# Patient Record
Sex: Female | Born: 1975 | Hispanic: Yes | Marital: Married | Smoking: Never smoker
Health system: Southern US, Community
[De-identification: ages and names within clinical notes are randomized; demographics above are authoritative.]

## PROBLEM LIST (undated history)

## (undated) ENCOUNTER — Inpatient Hospital Stay (HOSPITAL_COMMUNITY): Payer: Self-pay

## (undated) DIAGNOSIS — IMO0001 Reserved for inherently not codable concepts without codable children: Secondary | ICD-10-CM

## (undated) DIAGNOSIS — O26613 Liver and biliary tract disorders in pregnancy, third trimester: Secondary | ICD-10-CM

## (undated) DIAGNOSIS — K8020 Calculus of gallbladder without cholecystitis without obstruction: Secondary | ICD-10-CM

## (undated) DIAGNOSIS — B029 Zoster without complications: Secondary | ICD-10-CM

## (undated) DIAGNOSIS — D649 Anemia, unspecified: Secondary | ICD-10-CM

## (undated) DIAGNOSIS — N719 Inflammatory disease of uterus, unspecified: Secondary | ICD-10-CM

## (undated) DIAGNOSIS — K5900 Constipation, unspecified: Secondary | ICD-10-CM

## (undated) HISTORY — DX: Liver and biliary tract disorders in pregnancy, third trimester: O26.613

## (undated) HISTORY — DX: Constipation, unspecified: K59.00

## (undated) HISTORY — DX: Calculus of gallbladder without cholecystitis without obstruction: K80.20

## (undated) HISTORY — PX: WISDOM TOOTH EXTRACTION: SHX21

## (undated) HISTORY — DX: Reserved for inherently not codable concepts without codable children: IMO0001

## (undated) HISTORY — DX: Zoster without complications: B02.9

---

## 2007-01-13 ENCOUNTER — Encounter: Admission: RE | Admit: 2007-01-13 | Discharge: 2007-01-13 | Payer: Self-pay | Admitting: Family Medicine

## 2007-07-26 ENCOUNTER — Emergency Department (HOSPITAL_COMMUNITY): Admission: EM | Admit: 2007-07-26 | Discharge: 2007-07-26 | Payer: Self-pay | Admitting: Emergency Medicine

## 2007-09-08 ENCOUNTER — Ambulatory Visit: Payer: Self-pay | Admitting: Family Medicine

## 2007-09-08 LAB — CONVERTED CEMR LAB
Basophils Absolute: 0 K/uL
Basophils Relative: 0 %
Eosinophils Absolute: 0 K/uL
Eosinophils Relative: 0 %
HCT: 37.9 %
Hemoglobin: 12.5 g/dL
Lymphocytes Relative: 28 %
Lymphs Abs: 2 K/uL
MCHC: 33 g/dL
MCV: 87.3 fL
Monocytes Absolute: 0.3 K/uL
Monocytes Relative: 4 %
Neutro Abs: 4.8 K/uL
Neutrophils Relative %: 68 %
Platelets: 213 K/uL
RBC: 4.34 M/uL
RDW: 13.8 %
Sed Rate: 7 mm/h
T3, Total: 116 ng/dL
T4, Total: 8.3 ug/dL
TSH: 0.514 u[IU]/mL
WBC: 7.1 10*3/microliter

## 2007-09-11 ENCOUNTER — Ambulatory Visit: Payer: Self-pay | Admitting: Internal Medicine

## 2007-09-11 ENCOUNTER — Encounter: Payer: Self-pay | Admitting: Family Medicine

## 2007-09-11 LAB — CONVERTED CEMR LAB
AST: 15 units/L (ref 0–37)
Albumin: 4.4 g/dL (ref 3.5–5.2)
BUN: 11 mg/dL (ref 6–23)
Chloride: 107 meq/L (ref 96–112)
Creatinine, Ser: 0.72 mg/dL (ref 0.40–1.20)
HDL: 52 mg/dL (ref >39)
LDL Cholesterol: 76 mg/dL (ref 0–99)
Potassium: 4.5 meq/L (ref 3.5–5.3)
Total CHOL/HDL Ratio: 2.9

## 2007-09-12 ENCOUNTER — Ambulatory Visit: Payer: Self-pay | Admitting: *Deleted

## 2007-09-12 ENCOUNTER — Ambulatory Visit (HOSPITAL_COMMUNITY): Admission: RE | Admit: 2007-09-12 | Discharge: 2007-09-12 | Payer: Self-pay | Admitting: Family Medicine

## 2007-10-11 ENCOUNTER — Ambulatory Visit: Payer: Self-pay | Admitting: Internal Medicine

## 2007-11-13 ENCOUNTER — Emergency Department (HOSPITAL_COMMUNITY): Admission: EM | Admit: 2007-11-13 | Discharge: 2007-11-13 | Payer: Self-pay | Admitting: Family Medicine

## 2008-03-29 DIAGNOSIS — B029 Zoster without complications: Secondary | ICD-10-CM

## 2008-03-29 HISTORY — DX: Zoster without complications: B02.9

## 2008-05-10 ENCOUNTER — Encounter: Payer: Self-pay | Admitting: Family Medicine

## 2008-05-10 ENCOUNTER — Ambulatory Visit: Payer: Self-pay | Admitting: Internal Medicine

## 2008-05-10 LAB — CONVERTED CEMR LAB: Chlamydia, DNA Probe: NEGATIVE

## 2008-05-22 ENCOUNTER — Ambulatory Visit: Payer: Self-pay | Admitting: Family Medicine

## 2008-05-28 ENCOUNTER — Ambulatory Visit (HOSPITAL_COMMUNITY): Admission: RE | Admit: 2008-05-28 | Discharge: 2008-05-28 | Payer: Self-pay | Admitting: Family Medicine

## 2008-05-28 ENCOUNTER — Encounter (INDEPENDENT_AMBULATORY_CARE_PROVIDER_SITE_OTHER): Payer: Self-pay | Admitting: *Deleted

## 2008-06-26 ENCOUNTER — Ambulatory Visit: Payer: Self-pay | Admitting: Family Medicine

## 2008-09-02 ENCOUNTER — Ambulatory Visit: Payer: Self-pay | Admitting: Internal Medicine

## 2008-11-01 ENCOUNTER — Ambulatory Visit: Payer: Self-pay | Admitting: Internal Medicine

## 2008-11-14 ENCOUNTER — Ambulatory Visit: Payer: Self-pay | Admitting: Gastroenterology

## 2008-11-14 DIAGNOSIS — K219 Gastro-esophageal reflux disease without esophagitis: Secondary | ICD-10-CM

## 2008-11-21 ENCOUNTER — Telehealth: Payer: Self-pay | Admitting: Gastroenterology

## 2008-11-27 ENCOUNTER — Telehealth (INDEPENDENT_AMBULATORY_CARE_PROVIDER_SITE_OTHER): Payer: Self-pay | Admitting: *Deleted

## 2008-11-29 ENCOUNTER — Encounter (INDEPENDENT_AMBULATORY_CARE_PROVIDER_SITE_OTHER): Payer: Self-pay | Admitting: *Deleted

## 2008-12-25 ENCOUNTER — Encounter: Payer: Self-pay | Admitting: Gastroenterology

## 2008-12-25 ENCOUNTER — Telehealth: Payer: Self-pay | Admitting: Gastroenterology

## 2009-01-03 ENCOUNTER — Ambulatory Visit: Payer: Self-pay | Admitting: Internal Medicine

## 2009-01-23 ENCOUNTER — Ambulatory Visit: Payer: Self-pay | Admitting: Gastroenterology

## 2010-12-22 LAB — POCT I-STAT, CHEM 8
BUN: 15
Calcium, Ion: 1.26
Hemoglobin: 13.3
Potassium: 4
Sodium: 140
TCO2: 26

## 2010-12-22 LAB — POCT RAPID STREP A: Streptococcus, Group A Screen (Direct): NEGATIVE

## 2012-05-15 ENCOUNTER — Encounter: Payer: Self-pay | Admitting: Gynecology

## 2012-05-15 ENCOUNTER — Other Ambulatory Visit (HOSPITAL_COMMUNITY)
Admission: RE | Admit: 2012-05-15 | Discharge: 2012-05-15 | Disposition: A | Discharge status: Home / Self Care | Payer: BC Managed Care – PPO | Source: Ambulatory Visit | Attending: Gynecology | Admitting: Gynecology

## 2012-05-15 ENCOUNTER — Ambulatory Visit (INDEPENDENT_AMBULATORY_CARE_PROVIDER_SITE_OTHER): Payer: BC Managed Care – PPO | Admitting: Gynecology

## 2012-05-15 VITALS — BP 120/76 | Ht 64.0 in | Wt 154.0 lb

## 2012-05-15 DIAGNOSIS — N318 Other neuromuscular dysfunction of bladder: Secondary | ICD-10-CM

## 2012-05-15 DIAGNOSIS — R635 Abnormal weight gain: Secondary | ICD-10-CM

## 2012-05-15 DIAGNOSIS — Z01419 Encounter for gynecological examination (general) (routine) without abnormal findings: Secondary | ICD-10-CM | POA: Insufficient documentation

## 2012-05-15 DIAGNOSIS — Z833 Family history of diabetes mellitus: Secondary | ICD-10-CM | POA: Insufficient documentation

## 2012-05-15 DIAGNOSIS — N3281 Overactive bladder: Secondary | ICD-10-CM

## 2012-05-15 DIAGNOSIS — Z1151 Encounter for screening for human papillomavirus (HPV): Secondary | ICD-10-CM | POA: Insufficient documentation

## 2012-05-15 DIAGNOSIS — N644 Mastodynia: Secondary | ICD-10-CM

## 2012-05-15 LAB — CBC WITH DIFFERENTIAL/PLATELET
Eosinophils Absolute: 0.1 10*3/uL (ref 0.0–0.7)
HCT: 36.3 % (ref 36.0–46.0)
Hemoglobin: 12.1 g/dL (ref 12.0–15.0)
Lymphocytes Relative: 31 % (ref 12–46)
Lymphs Abs: 2 10*3/uL (ref 0.7–4.0)
MCHC: 33.3 g/dL (ref 30.0–36.0)
Monocytes Absolute: 0.4 10*3/uL (ref 0.1–1.0)
Neutro Abs: 4.1 10*3/uL (ref 1.7–7.7)
Neutrophils Relative %: 60 % (ref 43–77)
RBC: 4.36 MIL/uL (ref 3.87–5.11)
RDW: 14.2 % (ref 11.5–15.5)

## 2012-05-15 LAB — HEMOGLOBIN A1C
Hgb A1c MFr Bld: 5.9 % — ABNORMAL HIGH (ref <5.7)
Mean Plasma Glucose: 123 mg/dL — ABNORMAL HIGH (ref <117)

## 2012-05-15 LAB — TSH: TSH: 1.727 u[IU]/mL (ref 0.350–4.500)

## 2012-05-15 NOTE — Patient Instructions (Addendum)
Informacin para el paciente: Tratamientos para la incontinencia por urgencia en mujeres   Qu es la incontinencia por urgencia? - "Incontinencia" es el trmino mdico que se [place] cuando una persona gotea orina o pierde el control de la vejiga. Las personas con incontinencia por urgencia sienten una fuerte necesidad o la "urgencia" de Geographical information systems officer de repente. A menudo, es tan grande la urgencia que no llegan al bao a tiempo. Si tiene una de estos deseos urgentes y repentinos de Geographical information systems officer pero no pierde [place], es posible que tenga un padecimiento llamado "vejiga hiperactiva". La incontinencia por urgencia tambin se conoce como incontinencia por emergencia.  La incontinencia por urgencia es comn, especialmente en mujeres, pero existen tratamientos que pueden ayudar. Si tiene incontinencia por urgencia o vejiga hiperactiva, no tiene que "resignarse a ello".  Hay algo que pueda hacer por mi cuenta para la incontinencia por urgencia? - S. Los siguientes pasos pueden ayudar a reducir las prdidas de orina o los deseos urgentes de orinar:  ?Disminuya la cantidad de alimentos o bebidas que [employer] sntomas. El alcohol, la cafena, las comidas picantes o cidas, o los edulcorantes artificiales hacen que algunas personas deban orinar ms seguido o causan deseos urgentes y repentinos de Geographical information systems officer. ?Intente no beber demasiado antes de acostarse.  ?Evite el estreimiento - El estreimiento es un problema comn que dificulta la evacuacin y puede empeorar la incontinencia por [place]. Tambin puede probar algunas cosas para ayudar con el control de la vejiga: ?Entrenamiento de vejiga - El entrenamiento de vejiga ayuda a la vejiga a retener ms orina para que pueda orinar con Scientist, research (physical sciences). Durante el entrenamiento de vejiga, usted va al bao en momentos programados. Por ejemplo, puede decidir ir una vez por hora. Luego, se obliga a ir una vez por hora, incluso si no tiene  ganas. Si necesita ir antes, debe tratar de esperar hasta que transcurra Georgianne Fick. Despus de que se [place] a esperar una hora, puede intentar esperar ms entre cada visita al bao. Con el tiempo, puede llegar a entrenar su vejiga para poder esperar 3 o 4 horas entre las visitas al bao. ?La relajacin puede ayudar a controlar la urgencia de ir al bao. Cuando tenga una urgencia, prese quieto o sintese. Respire profundo, contraiga los msculos plvicos y deje que la "ola" de necesidad de Geographical information systems officer pase. Luego camine lentamente hasta el bao para orinar. ?Ejercicios con los msculos plvicos - Los ejercicios con los msculos plvicos fortalecen los msculos que controlan el flujo de [place]. Estos ejercicios pueden ayudar, pero a menudo las [employer] mal. Pregntele a su mdico o enfermero cmo se hacen correctamente. Cmo tratan los mdicos la incontinencia por urgencia en mujeres? - Los principales tratamientos incluyen medicinas y procedimientos para relajar la vejiga. Estos se analizan en ms detalle a continuacin.  Medicinas para relajar la vejiga - Pueden ayudar a controlar los sntomas. Estas medicinas son, Eusebio Me otras: la oxibutinina (nombre comercial: Ditropan), la tolterodina (nombre comercial: Detrol), la fesoterodina (nombre comercial: Gala Murdoch), la solifenacina (nombre comercial: VESIcare) y la darifenacina (nombre comercial: Enablex). Vienen en pldoras que se toman por la boca, y parches o geles que se colocan en la piel. Las medicinas para incontinencia por urgencia pueden causar efectos secundarios, como por ejemplo: ?Boca muy seca ?Estreimiento  ?Acidez estomacal  ?Dificultad para pensar y recordar cosas ?Visin borrosa ?Frecuencia cardaca rpida ?Somnolencia Si es mayor, pregunte a su mdico si es Research scientist (life sciences). Si tiene problemas para pensar o recordar cosas, algunas de  estas medicinas podran empeorar esos problemas.  Si toma medicinas para la  incontinencia por urgencia o vejiga hiperactiva, es posible que necesite probar varias medicinas hasta que encuentre un tratamiento que funcione para usted. Si las medicinas que relajan la vejiga no Education officer, environmental o le causan demasiados efectos secundarios, hable con su mdico sobre otros tratamientos. Para algunas mujeres que tienen resequedad vaginal despus de la menopausia, las cremas con estrgeno pueden ser un tratamiento eficaz. Procedimientos para ayudar a Engineer, mining vejiga - Si las medicinas no dan resultado para sus sntomas, o si no puede tomar medicinas, su mdico podra sugerir uno de [employer] procedimientos:  ?Una inyeccin de toxina botulnica (Botox) en la vejiga para ayudar a mantenerla relajada - La inyeccin debe colocarse aproximadamente una vez por ao. Estas inyecciones pueden causar problemas para orinar en alrededor de 1 de cada 4 mujeres que las reciben. ?Tratamiento con estimulacin nerviosa elctrica - Se realiza con un dispositivo que se coloca debajo de la piel, como un marcapasos. La estimulacin elctrica enva seales elctricas leves a los nervios que afectan la vejiga. Las seales no duelen. Este tratamiento puede reducir los deseos urgentes y repentinos de Geographical information systems officer, o la necesidad de Geographical information systems officer con frecuencia. Qu tan eficaces son los tratamientos para la incontinencia por urgencia en mujeres? - Depende de los sntomas de la mujer y de la causa de la incontinencia por [place]. Por ejemplo, si otro padecimiento de salud est causando la incontinencia, tratar ese padecimiento podra ayudar.  Si usted est recibiendo tratamiento para la incontinencia por urgencia, es posible que deba combinar tratamientos. Por ejemplo, puede tomar medicinas para ayudar con los sntomas de la incontinencia por [place] y adems hacer entrenamiento de vejiga. El tratamiento puede tardar en funcionar. No se desanime si un tratamiento tarda varios meses o incluso ms para funcionar. Cmo ser mi  vida? - La mayora de las mujeres con incontinencia por urgencia necesitan algn tipo de tratamiento durante mucho tiempo o de por vida. El tratamiento de la incontinencia por urgencia en mujeres puede ayudar con los sntomas, pero no cura la causa de la incontinencia.  Incluso si el tratamiento no elimina por completo la incontinencia por urgencia, puede hacer que su vida sea ms fcil y que se sienta mucho

## 2012-05-15 NOTE — Progress Notes (Signed)
Sheila Foster [birth year] [id number]   History:    37 y.o.  for annual gyn exam who is new to the practiceand had several complaints. She states that sometimes she has urgency incontinence and goes to the bathroom frequently but does not have to get up at night to urinate. She also suffers at time from breast tenderness right before her menses. She also had a cesarean section in [place] several years ago with a midline incision which patient states at times causes her some discomfort. She is considering getting pregnant in the next few months but wanted to make sure everything was fine. Her first child was delivered vaginally the second child by C-section due to nuchal cord possibly fetal distress. She is using condoms for contraception. She states she's had normal Pap smears in the past. She states her cycles are regular with the exception of December cycle and January start a week early. She in in frequently does her self breast examination. Several years ago she had an ultrasound the right breast which was benign. Patient's father had insulin-dependent diabetes and died from cardiovascular disease as well. Patient has complained of increased weight as well.  Past medical history,surgical history, family history and social history were all reviewed and documented in the EPIC chart.  Gynecologic History Patient's last menstrual period was 05/09/2012. Contraception: condoms Last [facility]. Results were: normal Last mammogram: not indicated. Results were: normal  Obstetric History OB History   Grav Para Term Preterm Abortions TAB SAB Ect Mult Living   2 2        2      # Outc Date GA Lbr Len/2nd Wgt Sex Del Anes PTL Lv   1 PAR            2 PAR                ROS: A ROS was performed and pertinent positives and negatives are included in the history.  GENERAL: No fevers or chills. HEENT: No change in vision, no earache, sore throat or sinus congestion. NECK: No  pain or stiffness. CARDIOVASCULAR: No chest pain or pressure. No palpitations. PULMONARY: No shortness of breath, cough or wheeze. GASTROINTESTINAL: No abdominal pain, nausea, vomiting or diarrhea, melena or bright red blood per rectum. GENITOURINARY: No urinary frequency, urgency, hesitancy or dysuria. MUSCULOSKELETAL: No joint or muscle pain, no back pain, no recent trauma. DERMATOLOGIC: No rash, no itching, no lesions. ENDOCRINE: No polyuria, polydipsia, no heat or cold intolerance. No recent change in weight. HEMATOLOGICAL: No anemia or easy bruising or bleeding. NEUROLOGIC: No headache, seizures, numbness, tingling or weakness. PSYCHIATRIC: No depression, no loss of interest in normal activity or change in sleep pattern.     Exam: chaperone present  BP 120/76  Ht 5\' 4"  (1.626 m)  Wt 154 lb (69.854 kg)  BMI 26.42 kg/m2  LMP 05/09/2012  Body mass index is 26.42 kg/(m^2).  General appearance : Well developed well nourished female. No acute distress HEENT: Neck supple, trachea midline, no carotid bruits, no thyroidmegaly Lungs: Clear to auscultation, no rhonchi or wheezes, or rib retractions  Heart: Regular rate and rhythm, no murmurs or gallops Breast:Examined in sitting and supine position were symmetrical in appearance, no palpable masses or tenderness,  no skin retraction, no nipple inversion, no nipple discharge, no skin discoloration, no axillary or supraclavicular lymphadenopathy Abdomen: no palpable masses or tenderness, no rebound or guarding Extremities: no edema or skin discoloration or tenderness  Pelvic:  Bartholin, Urethra, Skene Glands: Within normal limits             Vagina: No gross lesions or discharge  Cervix: No gross lesions or discharge  Uterus  anteverted, normal size, shape and consistency, non-tender and mobile  Adnexa  Without masses or tenderness  Anus and perineum  normal   Rectovaginal  normal sphincter tone without palpated masses or tenderness              Hemoccult not indicated     Assessment/Plan:  37 y.o. female for annual exam a we'll try to obtain a copy of her surgical report from [place] so that we can sit down and discuss if she had a midline uterine incision or not so that when she does conceive her obstetrician we'll have to know this information to determine route of delivery. Patient was instructed to start taking vitamin E 600 units daily for her mastodynia especially right before her menses. Because her family history of diabetes a hemoglobin A1c will be drawn today along with her CBC, cholesterol, and urinalysis. Pap smear was done today. 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    Ok Edwards MD, 2:15 PM 05/15/2012

## 2012-05-16 ENCOUNTER — Other Ambulatory Visit: Payer: Self-pay | Admitting: Anesthesiology

## 2012-05-16 DIAGNOSIS — R7309 Other abnormal glucose: Secondary | ICD-10-CM

## 2012-05-16 LAB — URINALYSIS W MICROSCOPIC + REFLEX CULTURE
Bilirubin Urine: NEGATIVE
Casts: NONE SEEN
Glucose, UA: NEGATIVE mg/dL
Hgb urine dipstick: NEGATIVE

## 2012-05-17 ENCOUNTER — Encounter: Payer: Self-pay | Admitting: Gynecology

## 2012-05-17 ENCOUNTER — Other Ambulatory Visit: Payer: BC Managed Care – PPO

## 2012-05-17 ENCOUNTER — Ambulatory Visit (INDEPENDENT_AMBULATORY_CARE_PROVIDER_SITE_OTHER): Payer: BC Managed Care – PPO | Admitting: Gynecology

## 2012-05-17 VITALS — BP 120/78

## 2012-05-17 DIAGNOSIS — R7309 Other abnormal glucose: Secondary | ICD-10-CM

## 2012-05-17 DIAGNOSIS — N390 Urinary tract infection, site not specified: Secondary | ICD-10-CM

## 2012-05-17 LAB — GLUCOSE, RANDOM: Glucose, Bld: 101 mg/dL — ABNORMAL HIGH (ref 70–99)

## 2012-05-17 MED ORDER — NITROFURANTOIN MONOHYD MACRO 100 MG PO CAPS: 100.0000 mg | ORAL_CAPSULE | Freq: Two times a day (BID) | ORAL | Status: DC

## 2012-05-17 NOTE — Progress Notes (Signed)
Patient presented to the office complaining of several days of low abdominal discomfort and urinary frequency. She was seen in the office for annual exam on February 17. Her urine culture results came back today with the following:  Colony Count  >=100,000 COLONIES/ML   Preliminary Report  ENTEROCOCCUS SPECIES   Exam: Abdomen patient slightly tender suprapubic. No CVA tenderness. Pelvic: Bartholin urethra Skene was within normal limits Vagina: No lesions or discharge Cervix: No lesions or discharge uterus: Anteverted slightly tender suprapubically Adnexa: No palpable masses or tenderness Rectal exam: Not done  Assessment/plan: Urinary tract infection patient will be prescribed Macrobid one by mouth twice a day for 7 days.

## 2012-05-17 NOTE — Patient Instructions (Signed)
Infeccin urinaria   (Urinary Tract Infection)   La infeccin urinaria puede ocurrir en cualquier lugar del tracto urinario. El tracto urinario es un sistema de drenaje del cuerpo por el que se eliminan los desechos y el exceso de agua. El tracto urinario incluye dos riones, dos urteres, la vejiga y la uretra. Los riones son rganos que tienen forma de frijol. Cada rin tiene aproximadamente el tamao del puo. Estn situados debajo de las costillas, uno a cada lado de la columna vertebral  CAUSAS   La causa de la infeccin son los microbios, que son organismos microscpicos, que incluyen hongos, virus, y bacterias. Estos organismos son tan pequeos que slo pueden verse a travs del microscopio. Las bacterias son los microorganismos que ms comnmente causan infecciones urinarias.   SNTOMAS   Los sntomas pueden variar segn la edad y el sexo del paciente y por la ubicacin de la infeccin. Los sntomas en las mujeres jvenes incluyen la necesidad frecuente e intensa de orinar y una sensacin dolorosa de ardor en la vejiga o en la uretra durante la miccin. Las mujeres y los hombres mayores podrn sentir cansancio, temblores y debilidad y sentir dolores musculares y dolor abdominal. Si tiene fiebre, puede significar que la infeccin est en los riones. Otros sntomas son dolor en la espalda o en los lados debajo de las costillas, nuseas y vmitos.   DIAGNSTICO   Para diagnosticar una infeccin urinaria, el mdico le preguntar acerca de sus sntomas. Tambin le solicitar una muestra de orina. La muestra de orina se analiza para detectar bacterias y glbulos blancos de la sangre. Los glbulos blancos se forman en el organismo para ayudar a combatir las infecciones.   TRATAMIENTO   Por lo general, las infecciones urinarias pueden tratarse con medicamentos. Debido a que la mayora de las infecciones son causadas por bacterias, por lo general pueden tratarse con antibiticos. La eleccin del antibitico y la  duracin del tratamiento depender de sus sntomas y el tipo de bacteria causante de la infeccin.   INSTRUCCIONES PARA EL CUIDADO EN EL HOGAR    Si le recetaron antibiticos, tmelos exactamente como su mdico le indique. Termine el medicamento aunque se sienta mejor despus de haber tomado slo algunos.   Beba gran cantidad de lquido para mantener la orina de tono claro o color amarillo plido.   Evite la cafena, el t y las bebidas gaseosas. Estas sustancias irritan la vejiga.   Vaciar la vejiga con frecuencia. Evite retener la orina durante largos perodos.   Vace la vejiga antes y despus de tener relaciones sexuales.   Despus de mover el intestino, las mujeres deben higienizarse la regin perineal desde adelante hacia atrs. Use slo un papel tissue por vez.  SOLICITE ATENCIN MDICA SI:    Siente dolor en la espalda.   Le sube la fiebre.   Los sntomas no mejoran luego de 3 das.  SOLICITE ATENCIN MDICA DE INMEDIATO SI:    Siente dolor intenso en la espalda o en la zona inferior del abdomen.   Comienza a sentir escalofros.   Tiene nuseas o vmitos.   Tiene una sensacin continua de quemazn o molestias al orinar.  ASEGRESE DE QUE:    Comprende estas instrucciones.   Controlar su enfermedad.   Solicitar ayuda de inmediato si no mejora o si empeora.  Document Released: 12/23/2004 Document Revised: 09/14/2011  Exit[employer] Patient Information 2013 [employer].

## 2012-05-18 MED ORDER — NITROFURANTOIN MONOHYD MACRO 100 MG PO CAPS: 100.0000 mg | ORAL_CAPSULE | Freq: Two times a day (BID) | ORAL | Status: DC

## 2012-05-18 NOTE — Addendum Note (Signed)
Addended by: Bertram Savin A on: 05/18/2012 02:53 PM   Modules accepted: Orders

## 2012-07-05 ENCOUNTER — Ambulatory Visit: Payer: BC Managed Care – PPO

## 2012-07-05 ENCOUNTER — Ambulatory Visit (INDEPENDENT_AMBULATORY_CARE_PROVIDER_SITE_OTHER): Payer: BC Managed Care – PPO | Admitting: Emergency Medicine

## 2012-07-05 VITALS — BP 102/60 | HR 77 | Temp 98.1°F | Resp 16 | Ht 64.5 in | Wt 154.0 lb

## 2012-07-05 DIAGNOSIS — J209 Acute bronchitis, unspecified: Secondary | ICD-10-CM

## 2012-07-05 DIAGNOSIS — R05 Cough: Secondary | ICD-10-CM

## 2012-07-05 DIAGNOSIS — R059 Cough, unspecified: Secondary | ICD-10-CM

## 2012-07-05 MED ORDER — AZITHROMYCIN 250 MG PO TABS: ORAL_TABLET | ORAL | Status: DC

## 2012-07-05 MED ORDER — HYDROCOD POLST-CHLORPHEN POLST 10-8 MG/5ML PO LQCR: 5.0000 mL | Freq: Two times a day (BID) | ORAL | Status: DC | PRN

## 2012-07-05 NOTE — Progress Notes (Addendum)
[facility] [address], [place] [address] [phone]  Date:  07/05/2012   Name:  Sheila Foster   DOB:  Jun 08, 1975   MRN:  [id number]  PCP:  No primary provider on file.    Chief Complaint: Cough and Back Pain   History of Present Illness:  Sheila Foster is a 37 y.o. very pleasant female patient who presents with the following:  Has a cough productive purulent sputum for over a week.  No wheezing or shortness of breath.  Trace of blood now with sputum production.  No fever but chills.  No nausea or vomiting.  No coryza or sore throat.  No improvement with over the counter medications or other home remedies. Denies other complaint or health concern today.   Patient Active Problem List  Diagnosis  . ESOPHAGEAL REFLUX  . Family history of type II diabetes mellitus  . Mastodynia, female  . OAB (overactive bladder)  . Weight gain    History reviewed. No pertinent past medical history.  Past Surgical History  Procedure Laterality Date  . Cesarean section      History  Substance Use Topics  . Smoking status: Never Smoker   . Smokeless tobacco: Never Used  . Alcohol Use: No    Family History  Problem Relation Age of Onset  . Heart disease Father   . Hypertension Father   . Diabetes Father     Allergies  Allergen Reactions  . Dexlansoprazole     Medication list has been reviewed and updated.  Current Outpatient Prescriptions on File Prior to Visit  Medication Sig Dispense Refill  . nitrofurantoin, macrocrystal-monohydrate, (MACROBID) 100 MG capsule Take 1 capsule (100 mg total) by mouth 2 (two) times daily.  14 capsule  0  . nitrofurantoin, macrocrystal-monohydrate, (MACROBID) 100 MG capsule Take 100 mg by mouth 2 (two) times daily.       No current facility-administered medications on file prior to visit.    Review of Systems:  As per HPI, otherwise negative.    Physical Examination: Filed Vitals:   07/05/12 1434  BP:  102/60  Pulse: 77  Temp: 98.1 F (36.7 C)  Resp: 16   Filed Vitals:   07/05/12 1434  Height: 5' 4.5" (1.638 m)  Weight: 154 lb (69.854 kg)   Body mass index is 26.04 kg/(m^2). Ideal Body Weight: Weight in (lb) to have BMI = 25: 147.6  GEN: WDWN, NAD, Non-toxic, A & O x 3 HEENT: Atraumatic, Normocephalic. Neck supple. No masses, No LAD. Ears and Nose: No external deformity. CV: RRR, No M/G/R. No JVD. No thrill. No extra heart sounds. PULM: CTA B, no wheezes, crackles, rhonchi. No retractions. No resp. distress. No accessory muscle use. ABD: S, NT, ND, +BS. No rebound. No HSM. EXTR: No c/c/e NEURO Normal gait.  PSYCH: Normally interactive. Conversant. Not depressed or anxious appearing.  Calm demeanor.    Assessment and Plan: Bronchitis zpak tussionex  Signed,  Phillips Odor, MD   [facility] reading (PRIMARY) by  Dr. Dareen Piano.  negative.

## 2012-07-05 NOTE — Patient Instructions (Addendum)

## 2012-07-12 ENCOUNTER — Ambulatory Visit (INDEPENDENT_AMBULATORY_CARE_PROVIDER_SITE_OTHER): Payer: BC Managed Care – PPO | Admitting: Family Medicine

## 2012-07-12 ENCOUNTER — Encounter: Payer: Self-pay | Admitting: Family Medicine

## 2012-07-12 VITALS — BP 95/58 | HR 74 | Temp 98.0°F | Resp 16 | Ht 65.0 in | Wt 157.0 lb

## 2012-07-12 DIAGNOSIS — J45909 Unspecified asthma, uncomplicated: Secondary | ICD-10-CM

## 2012-07-12 DIAGNOSIS — R05 Cough: Secondary | ICD-10-CM

## 2012-07-12 DIAGNOSIS — J309 Allergic rhinitis, unspecified: Secondary | ICD-10-CM

## 2012-07-12 DIAGNOSIS — K219 Gastro-esophageal reflux disease without esophagitis: Secondary | ICD-10-CM

## 2012-07-12 MED ORDER — BENZONATATE 100 MG PO CAPS: 100.0000 mg | ORAL_CAPSULE | Freq: Three times a day (TID) | ORAL | Status: DC | PRN

## 2012-07-12 MED ORDER — ALBUTEROL SULFATE HFA 108 (90 BASE) MCG/ACT IN AERS: 2.0000 | INHALATION_SPRAY | Freq: Four times a day (QID) | RESPIRATORY_TRACT | Status: DC | PRN

## 2012-07-12 MED ORDER — RANITIDINE HCL 150 MG PO TABS: 150.0000 mg | ORAL_TABLET | Freq: Two times a day (BID) | ORAL | Status: DC

## 2012-07-12 MED ORDER — HYDROCOD POLST-CHLORPHEN POLST 10-8 MG/5ML PO LQCR: 5.0000 mL | Freq: Two times a day (BID) | ORAL | Status: DC | PRN

## 2012-07-12 NOTE — Progress Notes (Signed)
Subjective:    Patient ID: Sheila Foster, female    DOB: [birth year], 37 y.o.   MRN: [id number]  HPI Sheila Foster is a 37 y.o. female Here for follow up of bronchitis - diagnosed 07/05/12 - started on Zpak, tussionex.  CXR 07/05/12: Findings: Normal lung volumes. Normal cardiac size and mediastinal  contours. Visualized tracheal air column is within normal limits. Lungs are clear. No osseous abnormality identified. IMPRESSION: Negative, no acute cardiopulmonary abnormality.   Improved some after antibiotic, then finished antibiotic and cough syrup few days ago, and cough returned. No fever, sore to breath at times, but not chest pain. Clear nasal discharge has returned.  No recent meds- including otc meds.  Hx of AR few years ago - no recent meds. No known sick contacts.  Has been having slight heartburn, no recent meds.    Review of Systems  Constitutional: Negative for fever and chills.  HENT: Positive for congestion and rhinorrhea. Negative for trouble swallowing.   Respiratory: Positive for cough and shortness of breath (notes with dust exposure at times.  son with asthma. ). Negative for wheezing.   Gastrointestinal:       +heartburn   As above.       Objective:   Physical Exam  Constitutional: She is oriented to person, place, and time. She appears well-developed and well-nourished. No distress.  HENT:  Head: Normocephalic and atraumatic.  Right Ear: Hearing, tympanic membrane, external ear and ear canal normal.  Left Ear: Hearing, tympanic membrane, external ear and ear canal normal.  Nose: Nose normal.  Mouth/Throat: Oropharynx is clear and moist. No oropharyngeal exudate.  Eyes: Conjunctivae and EOM are normal. Pupils are equal, round, and reactive to light.  Cardiovascular: Normal rate, regular rhythm, normal heart sounds and intact distal pulses.   No murmur heard. Pulmonary/Chest: Effort normal and breath sounds normal. No respiratory distress. She has no wheezes.  She has no rhonchi.  Neurological: She is alert and oriented to person, place, and time.  Skin: Skin is warm and dry. No rash noted.  Psychiatric: She has a normal mood and affect. Her behavior is normal.          Assessment & Plan:  Sheila Foster is a 37 y.o. female RAD (reactive airway disease) - Plan: albuterol (PROVENTIL HFA;VENTOLIN HFA) 108 (90 BASE) MCG/ACT inhaler  GERD (gastroesophageal reflux disease) - Plan: ranitidine (ZANTAC) 150 MG tablet  Cough - Plan: chlorpheniramine-HYDROcodone (TUSSIONEX PENNKINETIC ER) 10-8 MG/5ML LQCR, benzonatate (TESSALON) 100 MG capsule, ranitidine (ZANTAC) 150 MG tablet  Allergic rhinitis  Possible multifactorial cough. Clear on exam, less likely asthma but possible RAD on hx. LPR with GERD and allergic rhinitis contributor, and post infectious cough.  trial of zantac, zyrtec or allegra, tessalon during day, proair if needed (correct use discussed), and tussionex at night if needed. rtc precautions discussed and understanding expressed.   Meds ordered this encounter  Medications  . chlorpheniramine-HYDROcodone (TUSSIONEX PENNKINETIC ER) 10-8 MG/5ML LQCR    Sig: Take 5 mLs by mouth every 12 (twelve) hours as needed.    Dispense:  60 mL    Refill:  0  . benzonatate (TESSALON) 100 MG capsule    Sig: Take 1 capsule (100 mg total) by mouth 3 (three) times daily as needed for cough.    Dispense:  20 capsule    Refill:  0  . ranitidine (ZANTAC) 150 MG tablet    Sig: Take 1 tablet (150 mg total) by mouth 2 (two) times  daily.    Dispense:  30 tablet    Refill:  0  . albuterol (PROVENTIL HFA;VENTOLIN HFA) 108 (90 BASE) MCG/ACT inhaler    Sig: Inhale 2 puffs into the lungs every 6 (six) hours as needed for wheezing.    Dispense:  1 Inhaler    Refill:  0   Patient Instructions  Start zantac as prescribed for heartburn, Zyrtec or Allegra - over the counter one per day for allergies. Tessalon during day if needed for cough, and albuterol only  if needed for wheezing or shortness of breath. Ok to take hydrocodone cough syrup at night only if not short of breath or wheezing. If not improving into next week - recheck. Return to the clinic or go to the nearest emergency room if any of your symptoms worsen or new symptoms occur.

## 2012-07-12 NOTE — Patient Instructions (Signed)
Start zantac as prescribed for heartburn, Zyrtec or Allegra - over the counter one per day for allergies. Tessalon during day if needed for cough, and albuterol only if needed for wheezing or shortness of breath. Ok to take hydrocodone cough syrup at night only if not short of breath or wheezing. If not improving into next week - recheck. Return to the clinic or go to the nearest emergency room if any of your symptoms worsen or new symptoms occur.

## 2012-07-14 ENCOUNTER — Ambulatory Visit (INDEPENDENT_AMBULATORY_CARE_PROVIDER_SITE_OTHER): Payer: BC Managed Care – PPO | Admitting: Family Medicine

## 2012-07-14 ENCOUNTER — Ambulatory Visit: Payer: BC Managed Care – PPO

## 2012-07-14 VITALS — BP 110/70 | HR 76 | Temp 98.5°F | Resp 16 | Ht 65.0 in | Wt 157.0 lb

## 2012-07-14 DIAGNOSIS — M549 Dorsalgia, unspecified: Secondary | ICD-10-CM

## 2012-07-14 DIAGNOSIS — K625 Hemorrhage of anus and rectum: Secondary | ICD-10-CM

## 2012-07-14 DIAGNOSIS — K5900 Constipation, unspecified: Secondary | ICD-10-CM

## 2012-07-14 DIAGNOSIS — R1032 Left lower quadrant pain: Secondary | ICD-10-CM

## 2012-07-14 LAB — POCT URINALYSIS DIPSTICK
Bilirubin, UA: NEGATIVE
Glucose, UA: NEGATIVE
Ketones, UA: NEGATIVE
Leukocytes, UA: NEGATIVE
Nitrite, UA: NEGATIVE
Protein, UA: NEGATIVE
Spec Grav, UA: 1.01
Urobilinogen, UA: 0.2
pH, UA: 6.5

## 2012-07-14 LAB — IFOBT (OCCULT BLOOD): IFOBT: POSITIVE

## 2012-07-14 LAB — POCT CBC
Granulocyte percent: 58.6 %G (ref 37–80)
HCT, POC: 39.1 % (ref 37.7–47.9)
Hemoglobin: 12.2 g/dL (ref 12.2–16.2)
Lymph, poc: 2.4 (ref 0.6–3.4)
MCH, POC: 27 pg (ref 27–31.2)
MCHC: 31.2 g/dL — AB (ref 31.8–35.4)
MCV: 86.5 fL (ref 80–97)
MID (cbc): 0.6 (ref 0–0.9)
MPV: 11.3 fL (ref 0–99.8)
POC Granulocyte: 4.2 (ref 2–6.9)
POC LYMPH PERCENT: 33.6 % (ref 10–50)
POC MID %: 7.8 %M (ref 0–12)
Platelet Count, POC: 230 10*3/uL (ref 142–424)
RBC: 4.52 M/uL (ref 4.04–5.48)
RDW, POC: 14.8 %
WBC: 7.2 10*3/uL (ref 4.6–10.2)

## 2012-07-14 LAB — COMPREHENSIVE METABOLIC PANEL WITH GFR
ALT: 90 U/L — ABNORMAL HIGH (ref 0–35)
Albumin: 4.3 g/dL (ref 3.5–5.2)
BUN: 10 mg/dL (ref 6–23)
Creat: 0.66 mg/dL (ref 0.50–1.10)
Glucose, Bld: 81 mg/dL (ref 70–99)
Potassium: 3.9 meq/L (ref 3.5–5.3)

## 2012-07-14 LAB — POCT UA - MICROSCOPIC ONLY
Bacteria, U Microscopic: NEGATIVE
Casts, Ur, LPF, POC: NEGATIVE
Crystals, Ur, HPF, POC: NEGATIVE
Mucus, UA: NEGATIVE
WBC, Ur, HPF, POC: NEGATIVE
Yeast, UA: NEGATIVE

## 2012-07-14 LAB — COMPREHENSIVE METABOLIC PANEL
AST: 63 U/L — ABNORMAL HIGH (ref 0–37)
Alkaline Phosphatase: 47 U/L (ref 39–117)
CO2: 28 mEq/L (ref 19–32)
Calcium: 9.7 mg/dL (ref 8.4–10.5)
Chloride: 102 mEq/L (ref 96–112)
Sodium: 137 mEq/L (ref 135–145)
Total Bilirubin: 0.3 mg/dL (ref 0.3–1.2)
Total Protein: 7.1 g/dL (ref 6.0–8.3)

## 2012-07-14 NOTE — Patient Instructions (Signed)
Constipacin - Adulto  (Constipation, Adult)  Constipacin significa que una persona tiene menos de 3 evacuaciones en una semana, hay dificultad para evacuar el intestino, o las heces son secas, duras, o ms grandes que lo normal. A medida que envejecemos el estreimiento es ms comn. Si intenta curar el estreimiento con medicamentos que producen la evacuacin intestinal (laxantes), el problema puede empeorar. El uso prolongado de laxantes puede hacer que los msculos del colon se debiliten. Una dieta baja en fibra, no tomar suficientes lquidos y el uso de ciertos medicamentos pueden Agricultural engineer.  CAUSAS   Ciertos medicamentos, como los antidepresivos, analgsicos, suplementos de hierro, anticidos y diurticos.   Algunas enfermedades, como la diabetes, el sndrome del colon irritable (SII), enfermedad de la tiroides, o depresin.   No beber suficiente agua.   No consumir suficientes alimentos ricos en fibra.   Situaciones de estrs o viajes.  Falta de actividad fsica o de ejercicio.  No ir al bao cuando siente la necesidad.  Ignorar la necesidad sbita de Film/video editor intestino.  Uso en exceso de laxantes. SNTOMAS   Evacuar el intestino menos de 3 veces a la semana.   Dificultad para mover el [place] y duras, o ms grandes que las normales.   Sensacin de estar lleno o distendido.   Dolor en la parte baja del abdomen  No se siente alivio despus de evacuar el intestino. DIAGNSTICO  El mdico le har una historia clnica y le har un examen fsico. Pueden hacerle exmenes adicionales para el estreimiento grave. Algunas pruebas son:   Un radiografa con enema de bario para examinar el recto, el colon y en algunos casos el intestino delgado.  Una sigmoidoscopia para examinar el colon inferior.  Una colonoscopia para examinar todo el colon. TRATAMIENTO  El tratamiento depender de la gravedad de la constipacin y de la  causa. Algunos tratamientos dietticos son beber ms lquidos y comer ms alimentos ricos en fibra. El cambio en el estilo de vida incluye hacer ejercicios de [place] regular. Si estas recomendaciones para Animator dieta y en el estilo de vida no ayudan, el mdico le puede indicar el uso de laxantes de venta libre para Industrial/product designer movimiento intestinal. Los medicamentos con Statistician se pueden prescribir si los medicamentos de venta libre no lo mejoran.  INSTRUCCIONES PARA EL CUIDADO EN EL HOGAR   Aumente el consumo de alimentos con [place], como frutas, verduras, granos enteros y frijoles. Limite los azcares ricos en grasas y procesados   en su dieta, tales como papas fritas, hamburguesas, galletas, dulces y refrescos.   Puede agregar un suplemento de fibra a su dieta si no obtiene lo suficiente de los alimentos.   Debe ingerir gran cantidad de lquido para mantener la orina de tono claro o color amarillo plido.   Haga ejercicios regularmente o segn las indicaciones de su mdico.   Vaya al bao cuando sienta la necesidad de ir. No espere.  Tome slo la medicacin que le indic el profesional.  No tome otros medicamentos para la constipacin sin Teacher, adult education a su mdico. SOLICITE ATENCIN MDICA DE INMEDIATO SI:   Observa sangre brillante en las heces.   La constipacin dura ms de 4 das o empeora.   Siente dolor abdominal o rectal.   Las heces son delgadas como un lpiz.  Pierde peso de [place] inexplicable. ASEGRESE DE QUE:   Comprende estas instrucciones.  Controlar su enfermedad.  Solicitar ayuda de inmediato si  no mejora o empeora. Document Released: 04/04/2007 Document Revised: 09/14/2011 [facility] Patient Information 2013 [place], [place].

## 2012-07-14 NOTE — Progress Notes (Signed)
[facility]:  Office Visit  Chief Complaint:  Chief Complaint  Patient presents with  . Follow-up  . Constipation    blood in the stool  . Abdominal Pain    HPI: Sheila Foster is a 37 y.o. female who complains of diffuse abd pain,  1 week ago came in for coughing and URI sxs, dx with acute bronchitis. Given z pack. Cough not improved then was rx Tussionex. She took it and then started having constipation and then had 2 BMs , initial one had tinge of blood in it, 2nd one this AM had BRBPR that filled toilet bowel. She took a picture of it. No fevers, chills, + nausea, no vomiting. No h/o UC, colon cancer, colitis . + LLQ abd pain, + low back pain. Pain with urination. All is dull pain. + flatus, PO normal  History reviewed. No pertinent past medical history. Past Surgical History  Procedure Laterality Date  . Cesarean section     History   Social History  . Marital Status: Married    Spouse Name: N/A    Number of Children: N/A  . Years of Education: N/A   Social History Main Topics  . Smoking status: Never Smoker   . Smokeless tobacco: Never Used  . Alcohol Use: No  . Drug Use: No  . Sexually Active: Yes    Birth Control/ Protection: Condom   Other Topics Concern  . None   Social History Narrative  . None   Family History  Problem Relation Age of Onset  . Heart disease Father   . Hypertension Father   . Diabetes Father    Allergies  Allergen Reactions  . Dexlansoprazole    Prior to Admission medications   Medication Sig Start Date End Date Taking? Authorizing Provider  albuterol (PROVENTIL HFA;VENTOLIN HFA) 108 (90 BASE) MCG/ACT inhaler Inhale 2 puffs into the lungs every 6 (six) hours as needed for wheezing. 07/12/12  Yes Shade Flood, MD  chlorpheniramine-HYDROcodone [facility] PENNKINETIC ER) 10-8 MG/5ML LQCR Take 5 mLs by mouth every 12 (twelve) hours as needed. 07/12/12  Yes Shade Flood, MD  ranitidine (ZANTAC) 150 MG tablet  Take 1 tablet (150 mg total) by mouth 2 (two) times daily. 07/12/12  Yes Shade Flood, MD  benzonatate (TESSALON) 100 MG capsule Take 1 capsule (100 mg total) by mouth 3 (three) times daily as needed for cough. 07/12/12   Shade Flood, MD     ROS: The patient denies fevers, chills, night sweats, unintentional weight loss, chest pain, palpitations, wheezing, dyspnea on exertion, vomiting, dysuria, hematuria,  numbness, weakness, or tingling.   All other systems have been reviewed and were otherwise negative with the exception of those mentioned in the HPI and as above.    PHYSICAL EXAM: Filed Vitals:   07/14/12 1435  BP: 110/70  Pulse: 76  Temp: 98.5 F (36.9 C)  Resp: 16   Filed Vitals:   07/14/12 1435  Height: 5\' 5"  (1.651 m)  Weight: 157 lb (71.215 kg)   Body mass index is 26.13 kg/(m^2).  General: Alert, no acute distress HEENT:  Normocephalic, atraumatic, oropharynx patent.  Cardiovascular:  Regular rate and rhythm, no rubs murmurs or gallops.  No Carotid bruits, radial pulse intact. No pedal edema.  Respiratory: Clear to auscultation bilaterally.  No wheezes, rales, or rhonchi.  No cyanosis, no use of accessory musculature GI: No organomegaly, abdomen is soft and + LLQ mild abd-tender, positive bowel sounds.  No masses. Skin: No rashes. Neurologic: Facial musculature symmetric. Psychiatric: Patient is appropriate throughout our interaction. Lymphatic: No cervical lymphadenopathy Musculoskeletal: Gait intact.  Rectal exam-no masses, fissures, hemorrhoids   LABS: Results for orders placed in visit on 07/14/12  POCT CBC      Result Value Range   WBC 7.2  4.6 - 10.2 K/uL   Lymph, poc 2.4  0.6 - 3.4   POC LYMPH PERCENT 33.6  10 - 50 %L   MID (cbc) 0.6  0 - 0.9   POC MID % 7.8  0 - 12 %M   POC Granulocyte 4.2  2 - 6.9   Granulocyte percent 58.6  37 - 80 %G   RBC 4.52  4.04 - 5.48 M/uL   Hemoglobin 12.2  12.2 - 16.2 g/dL   HCT, POC 16.1  09.6 - 47.9 %   MCV  86.5  80 - 97 fL   MCH, POC 27.0  27 - 31.2 pg   MCHC 31.2 (*) 31.8 - 35.4 g/dL   RDW, POC 04.5     Platelet Count, POC 230  142 - 424 K/uL   MPV 11.3  0 - 99.8 fL  IFOBT (OCCULT BLOOD)      Result Value Range   IFOBT Positive    POCT URINALYSIS DIPSTICK      Result Value Range   Color, UA yellow     Clarity, UA clear     Glucose, UA neg     Bilirubin, UA neg     Ketones, UA neg     Spec Grav, UA 1.010     Blood, UA trace-intact     pH, UA 6.5     Protein, UA neg     Urobilinogen, UA 0.2     Nitrite, UA neg     Leukocytes, UA Negative    POCT UA - MICROSCOPIC ONLY      Result Value Range   WBC, Ur, HPF, POC neg     RBC, urine, microscopic 0-3     Bacteria, U Microscopic neg     Mucus, UA neg     Epithelial cells, urine per micros 0-3     Crystals, Ur, HPF, POC neg     Casts, Ur, LPF, POC neg     Yeast, UA neg       EKG/XRAY:   Primary read interpreted by Dr. Conley Rolls at [facility] CXR is normal Abdomen shows no free air, no obstruction, + constipation, + calcification of ribs on left AP view No obvious kidney stones.    ASSESSMENT/PLAN: Encounter Diagnoses  Name Primary?  . Abdominal pain, left lower quadrant   . Rectal bleeding   . Back pain   . Unspecified constipation Yes   Miralax, colace otc Increase fluids F/u in 2 weeks for repeat hemosure CMP pending Obstruction precautions given     Sheila Kreiser PHUONG, DO 07/14/2012 3:45 PM

## 2012-07-15 ENCOUNTER — Telehealth: Payer: Self-pay | Admitting: Family Medicine

## 2012-07-15 DIAGNOSIS — R748 Abnormal levels of other serum enzymes: Secondary | ICD-10-CM

## 2012-07-15 NOTE — Telephone Encounter (Signed)
Called patient regarding labs and to see how she is doing.  She will return next week to get a CMP done for abnormal liver enzymes.

## 2012-07-19 ENCOUNTER — Ambulatory Visit (INDEPENDENT_AMBULATORY_CARE_PROVIDER_SITE_OTHER): Payer: BC Managed Care – PPO | Admitting: Family Medicine

## 2012-07-19 VITALS — BP 110/68 | HR 70 | Temp 98.2°F | Resp 16 | Ht 64.0 in | Wt 154.4 lb

## 2012-07-19 DIAGNOSIS — R195 Other fecal abnormalities: Secondary | ICD-10-CM

## 2012-07-19 DIAGNOSIS — R748 Abnormal levels of other serum enzymes: Secondary | ICD-10-CM

## 2012-07-19 LAB — POCT CBC
Granulocyte percent: 60.7 % (ref 37–80)
HCT, POC: 38.5 % (ref 37.7–47.9)
Hemoglobin: 11.9 g/dL — AB (ref 12.2–16.2)
Lymph, poc: 3.1 (ref 0.6–3.4)
MCH, POC: 26.6 pg — AB (ref 27–31.2)
MCHC: 30.9 g/dL — AB (ref 31.8–35.4)
MCV: 85.9 fL (ref 80–97)
MID (cbc): 0.8 (ref 0–0.9)
MPV: 10.8 fL (ref 0–99.8)
POC Granulocyte: 6.1 (ref 2–6.9)
POC LYMPH PERCENT: 31 % (ref 10–50)
POC MID %: 8.3 %M (ref 0–12)
Platelet Count, POC: 229 10*3/uL (ref 142–424)
RBC: 4.48 M/uL (ref 4.04–5.48)
RDW, POC: 14.8 %
WBC: 10 10*3/uL (ref 4.6–10.2)

## 2012-07-19 LAB — IFOBT (OCCULT BLOOD): IFOBT: NEGATIVE

## 2012-07-19 NOTE — Progress Notes (Signed)
[facility]:  Office Visit  Chief Complaint:  Chief Complaint  Patient presents with  . Follow-up    HPI: Sheila Foster is a 37 y.o. female who complains of  Here for recheck of positive hemosure and also elevated LFTs. Her stomach pains are improved sicne she stopped taking tussionex and also started on miralax. She has a long h.o constipation.  She has had regular BMs since Monday No bleeding  Past Medical History  Diagnosis Date  . Constipation    Past Surgical History  Procedure Laterality Date  . Cesarean section     History   Social History  . Marital Status: Married    Spouse Name: N/A    Number of Children: N/A  . Years of Education: N/A   Social History Main Topics  . Smoking status: Never Smoker   . Smokeless tobacco: Never Used  . Alcohol Use: No  . Drug Use: No  . Sexually Active: Yes    Birth Control/ Protection: Condom   Other Topics Concern  . None   Social History Narrative  . None   Family History  Problem Relation Age of Onset  . Heart disease Father   . Hypertension Father   . Diabetes Father    Allergies  Allergen Reactions  . Dexlansoprazole    Prior to Admission medications   Medication Sig Start Date End Date Taking? Authorizing Provider  albuterol (PROVENTIL HFA;VENTOLIN HFA) 108 (90 BASE) MCG/ACT inhaler Inhale 2 puffs into the lungs every 6 (six) hours as needed for wheezing. 07/12/12  Yes Sheila Flood, MD  benzonatate (TESSALON) 100 MG capsule Take 1 capsule (100 mg total) by mouth 3 (three) times daily as needed for cough. 07/12/12   Sheila Flood, MD  chlorpheniramine-HYDROcodone [facility] PENNKINETIC ER) 10-8 MG/5ML LQCR Take 5 mLs by mouth every 12 (twelve) hours as needed. 07/12/12   Sheila Flood, MD  ranitidine (ZANTAC) 150 MG tablet Take 1 tablet (150 mg total) by mouth 2 (two) times daily. 07/12/12   Sheila Flood, MD     ROS: The patient denies fevers, chills, night sweats,  unintentional weight loss, chest pain, palpitations, wheezing, dyspnea on exertion, nausea, vomiting, abdominal pain, dysuria, hematuria, melena, numbness, weakness, or tingling.   All other systems have been reviewed and were otherwise negative with the exception of those mentioned in the HPI and as above.    PHYSICAL EXAM: Filed Vitals:   07/19/12 1531  BP: 110/68  Pulse: 70  Temp: 98.2 F (36.8 C)  Resp: 16   Filed Vitals:   07/19/12 1531  Height: 5\' 4"  (1.626 m)  Weight: 154 lb 6.4 oz (70.035 kg)   Body mass index is 26.49 kg/(m^2).  General: Alert, no acute distress HEENT:  Normocephalic, atraumatic, oropharynx patent.  Cardiovascular:  Regular rate and rhythm, no rubs murmurs or gallops.  No Carotid bruits, radial pulse intact. No pedal edema.  Respiratory: Clear to auscultation bilaterally.  No wheezes, rales, or rhonchi.  No cyanosis, no use of accessory musculature GI: No organomegaly, abdomen is soft and non-tender, positive bowel sounds.  No masses. Skin: No rashes. Neurologic: Facial musculature symmetric. Psychiatric: Patient is appropriate throughout our interaction. Lymphatic: No cervical lymphadenopathy Musculoskeletal: Gait intact.   LABS: Results for orders placed in visit on 07/19/12  POCT CBC      Result Value Range   WBC 10.0  4.6 - 10.2 K/uL   Lymph, poc 3.1  0.6 - 3.4  POC LYMPH PERCENT 31.0  10 - 50 %L   MID (cbc) 0.8  0 - 0.9   POC MID % 8.3  0 - 12 %M   POC Granulocyte 6.1  2 - 6.9   Granulocyte percent 60.7  37 - 80 %G   RBC 4.48  4.04 - 5.48 M/uL   Hemoglobin 11.9 (*) 12.2 - 16.2 g/dL   HCT, POC 16.1  09.6 - 47.9 %   MCV 85.9  80 - 97 fL   MCH, POC 26.6 (*) 27 - 31.2 pg   MCHC 30.9 (*) 31.8 - 35.4 g/dL   RDW, POC 04.5     Platelet Count, POC 229  142 - 424 K/uL   MPV 10.8  0 - 99.8 fL  IFOBT (OCCULT BLOOD)      Result Value Range   IFOBT Negative       EKG/XRAY:   Primary read interpreted by Dr. Conley Foster at  [facility].   ASSESSMENT/PLAN: Encounter Diagnoses  Name Primary?  . Heme positive stool Yes  . Abnormal liver enzymes    Her IFOBT is negative today since she has been taking miralax and has been less constipated and stopped using the Tussionex CMP pending If CMP contineus to be elvated then I will add on Hepatitis Panels and also get abd [place] I suspect lvier enzymes may have been elevated due to medications she was on for back pain F/u prn pending test resutls     Sheila Bakula PHUONG, DO 07/20/2012 11:14 AM

## 2012-07-20 ENCOUNTER — Encounter: Payer: Self-pay | Admitting: Family Medicine

## 2012-07-26 ENCOUNTER — Telehealth: Payer: Self-pay

## 2012-07-26 DIAGNOSIS — R748 Abnormal levels of other serum enzymes: Secondary | ICD-10-CM

## 2012-07-26 NOTE — Telephone Encounter (Signed)
Unable to add a CMP from 4-23 because the blood has been disposed of. Will get a copy of the charge slip for you.

## 2012-07-26 NOTE — Telephone Encounter (Signed)
Message copied by Johnnette Litter on Wed Jul 26, 2012  6:29 PM ------      Message from: LE, [place] P      Created: Wed Jul 26, 2012 12:54 PM       Hi,            Can you look into this for me. I had asked for a CMP and it was not done. I don;t see the order for it. Can this be added on. I am not sure what happened? Also if you can pull her yellow sheet, I would like to see if it was notated. Leave a copy of it in my box.             Thanks,      Tle ------

## 2012-07-27 ENCOUNTER — Telehealth: Payer: Self-pay | Admitting: Family Medicine

## 2012-07-27 NOTE — Telephone Encounter (Signed)
I have called patient and she states she has an appt with Dr. Drue Novel next week so she will get her liver enzymes at that appt. She stillhas a standing order for CMP in the future with [place] if she decideds to come in and get the liver enzymes rechecked. She was advised to tell Dr. Drue Novel what is needed.

## 2012-07-28 ENCOUNTER — Other Ambulatory Visit (INDEPENDENT_AMBULATORY_CARE_PROVIDER_SITE_OTHER): Payer: BC Managed Care – PPO | Admitting: *Deleted

## 2012-07-28 VITALS — BP 106/65 | HR 88 | Temp 97.9°F | Resp 16 | Ht 64.0 in | Wt 156.0 lb

## 2012-07-28 DIAGNOSIS — R195 Other fecal abnormalities: Secondary | ICD-10-CM

## 2012-07-28 DIAGNOSIS — R748 Abnormal levels of other serum enzymes: Secondary | ICD-10-CM

## 2012-07-28 LAB — COMPREHENSIVE METABOLIC PANEL
AST: 15 U/L (ref 0–37)
BUN: 11 mg/dL (ref 6–23)
CO2: 27 mEq/L (ref 19–32)
Glucose, Bld: 96 mg/dL (ref 70–99)
Potassium: 4.6 mEq/L (ref 3.5–5.3)
Total Protein: 7.3 g/dL (ref 6.0–8.3)

## 2012-07-28 LAB — COMPREHENSIVE METABOLIC PANEL WITH GFR
ALT: 21 U/L (ref 0–35)
Albumin: 4.5 g/dL (ref 3.5–5.2)
Alkaline Phosphatase: 41 U/L (ref 39–117)
Calcium: 9.6 mg/dL (ref 8.4–10.5)
Chloride: 105 meq/L (ref 96–112)
Creat: 0.77 mg/dL (ref 0.50–1.10)
Sodium: 138 meq/L (ref 135–145)
Total Bilirubin: 0.3 mg/dL (ref 0.3–1.2)

## 2012-07-28 NOTE — Telephone Encounter (Signed)
07/19/12 OV charged CBC/hemosure and outside labs (CMP written on the bottom of the chargeslip)

## 2012-07-28 NOTE — Telephone Encounter (Signed)
Can you check the charges and make sure she was not charged for labs.

## 2012-07-28 NOTE — Telephone Encounter (Signed)
  -----   Message from Lenell Antu, DO sent at 07/27/2012 4:08 PM -----     Liliana Cline print out a copy of her last CMP and tell her in letter that her liver enzymes are elevated and that she shousdl get that rechecked with Dr. Drue Novel. I just want her to have a hard copy before next week. Thanks Tle    ----- Message from Lenell Antu, DO sent at 07/27/2012 4:07 PM -----     I don't know where the mistake occurred but I have asked patient to come in for repeat blood work at her convenience whenever she has availability in the next few day and get a CMP done. It was never done on her last visit. I do not think she should be charged a lab fee from a venipuncture point of view since she was stuck on her last visit and a CMP was never ordered. I have ordered the test. Thanks, Tle.

## 2012-07-30 ENCOUNTER — Telehealth: Payer: Self-pay

## 2012-07-30 NOTE — Telephone Encounter (Signed)
LMOM to CB. 

## 2012-07-30 NOTE — Telephone Encounter (Signed)
Message copied by Johnnette Litter on Sun Jul 30, 2012  9:54 AM ------      Message from: [place], [place]      Created: Sun Jul 30, 2012  8:51 AM       Please let her know that her liver enzymes are back to normal. Thanks her again for coming in. ------

## 2012-07-30 NOTE — Telephone Encounter (Signed)
Pt.notified

## 2012-08-02 ENCOUNTER — Encounter: Payer: Self-pay | Admitting: Internal Medicine

## 2012-08-02 ENCOUNTER — Ambulatory Visit (INDEPENDENT_AMBULATORY_CARE_PROVIDER_SITE_OTHER): Payer: BC Managed Care – PPO | Admitting: Internal Medicine

## 2012-08-02 VITALS — BP 98/62 | HR 78 | Temp 97.8°F | Ht 64.5 in | Wt 155.0 lb

## 2012-08-02 DIAGNOSIS — R7989 Other specified abnormal findings of blood chemistry: Secondary | ICD-10-CM

## 2012-08-02 DIAGNOSIS — R7309 Other abnormal glucose: Secondary | ICD-10-CM

## 2012-08-02 DIAGNOSIS — R7303 Prediabetes: Secondary | ICD-10-CM

## 2012-08-02 DIAGNOSIS — R05 Cough: Secondary | ICD-10-CM

## 2012-08-02 NOTE — Patient Instructions (Addendum)
Tome mucinex DM 1 tableta 2 veces al dia si tiene tos. Para las alergias, tome claritin 10 mg 1 tableta al dia regrese en 6 meses para un chequeo general

## 2012-08-02 NOTE — Progress Notes (Signed)
  Subjective:    Patient ID: Sheila Foster, female    DOB: [birth year], 37 y.o.   MRN: [id number]  HPI New patient, here to get established, she is concerned about her LFTs. In April, the patient was diagnosed with bronchitis, she was prescribed a number of medications including a Z-Pak, Tussionex. Shortly after she developed constipation and red blood per rectum. During the evaluation, LFTs were checked, they were elevated. Rechecked LFTs were normal. At this point, she feels better, she still has cough mostly in the morning. When asked, admits that for a while she coughs white sputum on and off.  Past Medical History  Diagnosis Date  . Constipation   . Shingles 2010  . Contraception     condoms    Past Surgical History  Procedure Laterality Date  . Cesarean section     History   Social History  . Marital Status: Married    Spouse Name: N/A    Number of Children: 2  . Years of Education: N/A   Occupational History  . housekeeper     Social History Main Topics  . Smoking status: Never Smoker   . Smokeless tobacco: Never Used  . Alcohol Use: No  . Drug Use: No  . Sexually Active: Yes    Birth Control/ Protection: Condom   Other Topics Concern  . Not on file   Social History Narrative   Education 9th grade   Original from [place]   Family History  Problem Relation Age of Onset  . Heart disease Father     MI age 39  . Hypertension Father   . Diabetes Father   . Colon cancer Neg Hx   . Breast cancer Neg Hx   . Uterine cancer Other     GM   History   Social History  . Marital Status: Married    Spouse Name: N/A    Number of Children: 2  . Years of Education: N/A   Occupational History  . housekeeper     Social History Main Topics  . Smoking status: Never Smoker   . Smokeless tobacco: Never Used  . Alcohol Use: No  . Drug Use: No  . Sexually Active: Yes    Birth Control/ Protection: Condom   Other Topics Concern  . Not on file   Social  History Narrative   Education 9th grade   Original from [place]     Review of Systems RBPR and constipation rersolved Denies fever or chills. No GERD. No wheezing per se. Occasional sneezing, itchy eyes and itchy nose particularly when she goes outdoors.     Objective:   Physical Exam BP 98/62  Pulse 78  Temp(Src) 97.8 F (36.6 C) (Oral)  Ht 5' 4.5" (1.638 m)  Wt 155 lb (70.308 kg)  BMI 26.2 kg/m2  SpO2 97%  LMP 07/28/2012  General -- alert, well-developed, Healthy appearing female Lungs -- normal respiratory effort, no intercostal retractions, no accessory muscle use, and normal breath sounds.   Heart-- normal rate, regular rhythm, no murmur, and no gallop.   Extremities-- no pretibial edema bilaterally  Neurologic-- alert & oriented X3 and strength normal in all extremities. Psych-- Cognition and judgment appear intact. Alert and cooperative with normal attention span and concentration.  not anxious appearing and not depressed appearing.       Assessment & Plan:

## 2012-08-03 ENCOUNTER — Encounter: Payer: Self-pay | Admitting: Internal Medicine

## 2012-08-03 DIAGNOSIS — R05 Cough: Secondary | ICD-10-CM | POA: Insufficient documentation

## 2012-08-03 DIAGNOSIS — R7303 Prediabetes: Secondary | ICD-10-CM | POA: Insufficient documentation

## 2012-08-03 NOTE — Assessment & Plan Note (Signed)
Increased LFTs, LFTs were elevated x 1  in the setting of acute bronchitis and taking a number of medications including Tussionex, Microbid, a Z-Pak. Rechecked LFTs were normal. She does no drink alcohol. Plan: recheck in few months, it is hard to say if or what medication caused increased LFTs.

## 2012-08-03 NOTE — Assessment & Plan Note (Addendum)
Cough, Was diagnosed with bronchitis last month, was prescribed a number of medications including Tussionex, and developed constipation. Currently symptoms are better except for persistent cough, on further questioning reports that for a while she cough up white phlegm.  Denies wheezing or GERD. Plan:Mucinex DM, reassess in few months.  Doubt she has asthma but I will leave albuterol in her medication list. Instructions discussed in Spanish. Next visit in 6 months

## 2012-08-03 NOTE — Assessment & Plan Note (Signed)
Prediabetes, Few months ago  a hemoglobin A1c was 5.9, I told the patient she has prediabetes, the  right treatment is a healthy diet which was discussed. Will recheck in 6 months.

## 2012-08-16 ENCOUNTER — Ambulatory Visit (INDEPENDENT_AMBULATORY_CARE_PROVIDER_SITE_OTHER): Payer: BC Managed Care – PPO | Admitting: Internal Medicine

## 2012-08-16 VITALS — BP 98/64 | HR 73 | Temp 98.5°F | Wt 156.0 lb

## 2012-08-16 DIAGNOSIS — R05 Cough: Secondary | ICD-10-CM

## 2012-08-16 NOTE — Patient Instructions (Addendum)
Please make an appointment for 2 weeks from today ------ No tome los caramelos para la tos Comienze de nuevo DEXILANT una pastilla cada man~ana antes del desayuno Qnsal 2 sprays a cada lado de la [employer] Claritin 10 mg una tableta al dia mucinex DM si lo necesita regrese en 2 semanas, haga una cita.

## 2012-08-16 NOTE — Progress Notes (Signed)
  Subjective:    Patient ID: Sheila Foster, female    DOB: 1975/11/21, 37 y.o.   MRN: [id number]  HPI Acute visit Since the last time she was here, she got slightly better but 3 days ago started to cough again. Cough usually starts with a "tickle in the throat" then she can't stop coughing. Occasional white sputum production.  Past Medical History  Diagnosis Date  . Constipation   . Shingles 2010  . Contraception     condoms    Past Surgical History  Procedure Laterality Date  . Cesarean section       Review of Systems No fever or chills Admits to nose congestion and some nose itching for few days; also eyes itching for one day. She tried albuterol one time yesterday and did not help much. Denies classic heartburn, cough is not postprandial.    Objective:   Physical Exam General -- alert, well-developed, well appearing young female.   Neck --  no LADs HEENT -- TMs slt bulge, no redl, throat w/o redness, face symmetric and not tender to palpation, nose w/ minimal congestion Lungs -- normal respiratory effort, no intercostal retractions, no accessory muscle use, and normal breath sounds.   Heart-- normal rate, regular rhythm, no murmur, and no gallop.   Extremities-- no pretibial edema bilaterally Psych-- Cognition and judgment appear intact. Alert and cooperative with normal attention span and concentration.  not anxious appearing and not depressed appearing.       Assessment & Plan:

## 2012-08-16 NOTE — Assessment & Plan Note (Addendum)
Continue with cough, etiology unclear, chest x-ray last month negative. DDX includes occult GERD and allergies. Plan: Qnsal daily, sample and a Rx  Empiric PPI : Restart dexilant ( dexilant is in her allergies list because she developed LFTs elevation while on it but that happened  in the context of taking other meds at the same time thus we are not sure if she is allergic to PPIs) Claritin Discontinue OTC cough drops Follow up in 2 weeks, we'll recheck LFTs. Instructions discussed in Spanish.If not better, we'll refer to pulmonary.

## 2012-08-17 ENCOUNTER — Encounter: Payer: Self-pay | Admitting: Internal Medicine

## 2012-08-31 ENCOUNTER — Ambulatory Visit (INDEPENDENT_AMBULATORY_CARE_PROVIDER_SITE_OTHER): Payer: BC Managed Care – PPO | Admitting: Internal Medicine

## 2012-08-31 VITALS — BP 94/62 | HR 73 | Temp 98.0°F | Wt 157.0 lb

## 2012-08-31 DIAGNOSIS — R059 Cough, unspecified: Secondary | ICD-10-CM

## 2012-08-31 DIAGNOSIS — R05 Cough: Secondary | ICD-10-CM

## 2012-08-31 DIAGNOSIS — R7989 Other specified abnormal findings of blood chemistry: Secondary | ICD-10-CM

## 2012-08-31 MED ORDER — BECLOMETHASONE DIPROPIONATE 80 MCG/ACT NA AERS: 2.0000 | INHALATION_SPRAY | Freq: Every day | NASAL | Status: DC

## 2012-08-31 MED ORDER — DEXLANSOPRAZOLE 60 MG PO CPDR: 60.0000 mg | DELAYED_RELEASE_CAPSULE | Freq: Every day | ORAL | Status: DC

## 2012-08-31 NOTE — Progress Notes (Signed)
  Subjective:    Patient ID: Sheila Foster, female    DOB: 1975-08-26, 37 y.o.   MRN: [id number]  HPI Followup from previous visit. she was recommended dexilant, claritin and Qnsal She ran out of PPIs 4 days ago. Uses Qnsal only  when necessary. Cough has decrease.  Past Medical History  Diagnosis Date  . Constipation   . Shingles 2010  . Contraception     condoms    Past Surgical History  Procedure Laterality Date  . Cesarean section       Review of Systems Denies nausea, vomiting, diarrhea. No itchy nose or runny nose. No watery eyes. Still feels sometimes "phlegm coming from my stomach up", but denies a burning feeling or classic heartburn..    Objective:   Physical Exam  General -- alert, well-developed, well appearing young female.   Lungs -- normal respiratory effort, no intercostal retractions, no accessory muscle use, and normal breath sounds.  Heart-- normal rate, regular rhythm, no murmur, and no gallop.  Extremities-- no pretibial edema bilaterally  Psych-- Cognition and judgment appear intact. Alert and cooperative with normal attention span and concentration. not anxious appearing and not depressed appearing.     Assessment & Plan:

## 2012-08-31 NOTE — Patient Instructions (Addendum)
Arrange a office visit in 4 months --- regrese en 4 meses, continue las mismas medicinas

## 2012-08-31 NOTE — Assessment & Plan Note (Addendum)
Overall improved with PPIs, nasal steroids and Claritin. Plan: D/c albuterol  Refill dexilant Continue Claritin As needed Qnsal Check LFTs Come back in 4 months, sooner if symptoms resurface

## 2012-09-01 ENCOUNTER — Telehealth: Payer: Self-pay | Admitting: *Deleted

## 2012-09-01 LAB — ALT: ALT: 33 U/L (ref 0–35)

## 2012-09-01 NOTE — Telephone Encounter (Signed)
PA faxed awaiting response 

## 2012-09-02 ENCOUNTER — Encounter: Payer: Self-pay | Admitting: Internal Medicine

## 2012-09-04 ENCOUNTER — Encounter: Payer: Self-pay | Admitting: *Deleted

## 2012-09-06 ENCOUNTER — Telehealth: Payer: Self-pay | Admitting: General Practice

## 2012-09-06 NOTE — Telephone Encounter (Signed)
No, if the patient is okay with the change,, we can switch her to omeprazole 40 mg one by mouth daily before breakfast

## 2012-09-06 NOTE — Telephone Encounter (Signed)
Per Pt insurance, Dexilant needs a PA. Is there a reason the pt cannot use another PPI?

## 2012-09-07 MED ORDER — OMEPRAZOLE 40 MG PO CPDR: 40.0000 mg | DELAYED_RELEASE_CAPSULE | Freq: Every day | ORAL | Status: DC

## 2012-09-07 NOTE — Telephone Encounter (Signed)
Pt ok with the Omeprazole. Med sent to [facility] and Chart updated.

## 2012-10-12 NOTE — Telephone Encounter (Signed)
See duplicate encounter.

## 2013-01-01 ENCOUNTER — Ambulatory Visit (INDEPENDENT_AMBULATORY_CARE_PROVIDER_SITE_OTHER): Payer: BC Managed Care – PPO | Admitting: Internal Medicine

## 2013-01-01 ENCOUNTER — Encounter: Payer: Self-pay | Admitting: Internal Medicine

## 2013-01-01 VITALS — BP 93/63 | HR 56 | Temp 98.2°F | Wt 156.4 lb

## 2013-01-01 DIAGNOSIS — R05 Cough: Secondary | ICD-10-CM

## 2013-01-01 DIAGNOSIS — R7303 Prediabetes: Secondary | ICD-10-CM

## 2013-01-01 DIAGNOSIS — D649 Anemia, unspecified: Secondary | ICD-10-CM

## 2013-01-01 DIAGNOSIS — R7309 Other abnormal glucose: Secondary | ICD-10-CM

## 2013-01-01 DIAGNOSIS — R059 Cough, unspecified: Secondary | ICD-10-CM

## 2013-01-01 DIAGNOSIS — B029 Zoster without complications: Secondary | ICD-10-CM

## 2013-01-01 MED ORDER — VALACYCLOVIR HCL 1 G PO TABS: 1000.0000 mg | ORAL_TABLET | Freq: Three times a day (TID) | ORAL | Status: DC

## 2013-01-01 NOTE — Patient Instructions (Signed)
SOLO TOME VALTREX SI TIENE OTRO EPISODIO DE SHINGLES

## 2013-01-01 NOTE — Assessment & Plan Note (Signed)
Mild anemia per chart review, GU/GI review of systems negative. Plan: CBC, iron and ferritin

## 2013-01-01 NOTE — Assessment & Plan Note (Addendum)
Since the patient is here, we'll go ahead an check a A1c

## 2013-01-01 NOTE — Assessment & Plan Note (Signed)
Controlled, continue with PPIs

## 2013-01-01 NOTE — Progress Notes (Signed)
  Subjective:    Patient ID: Sheila Foster, female    DOB: July 15, 1975, 37 y.o.   MRN: [id number]  HPI Routine followup Cough --On PPIs, symptoms essentially resolved. Also complains of another episode of shingles: Had an episode in 2010  and again a month ago, both located on the right side   This last episode was milder, very few blisters but did have a burning pain in a L3 distribution. Pain is getting better She is concerned because since the second shingles episodes has occasional pain around the right eye described as burning and she wonders if it is related to shingles. On chart review, she had mild anemia and prediabetes, see assessment and plan  Past Medical History  Diagnosis Date  . Constipation   . Shingles 2010  . Contraception     condoms    Past Surgical History  Procedure Laterality Date  . Cesarean section      x1   History   Social History  . Marital Status: Married    Spouse Name: N/A    Number of Children: 2  . Years of Education: N/A   Occupational History  . housekeeper     Social History Main Topics  . Smoking status: Never Smoker   . Smokeless tobacco: Never Used  . Alcohol Use: No  . Drug Use: No  . Sexual Activity: Yes    Birth Control/ Protection: Condom   Other Topics Concern  . Not on file   Social History Narrative   Education 9th grade   Original from [place]    Review of Systems  No fever chills No facial rash, No sinus congestion or discharge. + Lack of energy for few days No nausea, vomiting, diarrhea or blood in the stools. Periods are monthly, once a month and  heavy for the first 2 days only.      Objective:   Physical Exam BP 93/63  Pulse 56  Temp(Src) 98.2 F (36.8 C)  Wt 156 lb 6.4 oz (70.943 kg)  BMI 26.44 kg/m2  SpO2 100% General -- alert, well-developed, NAD.   HEENT-- Not pale.   Face symmetric, sinuses not tender to palpation. Nose not congested.  Lungs -- normal respiratory effort, no intercostal  retractions, no accessory muscle use, and normal breath sounds.  Heart-- normal rate, regular rhythm, no murmur.  Skn-- No rash or scars at the back, abdomen or face Extremities-- no pretibial edema bilaterally  Neurologic--  alert & oriented X3. Speech normal, gait normal, strength normal in all extremities.  EOMI, PERLA   Psych-- Cognition and judgment appear intact. Cooperative with normal attention span and concentration. No anxious appearing , no depressed appearing.      Assessment & Plan:

## 2013-01-01 NOTE — Assessment & Plan Note (Addendum)
History of shingles shot 2010 and, apparently had a smaller outbreak a month ago Plan: I gave her a prescription for Valtrex 1 g 3 times a day for one week if she ever develops a rash again. Clearly explained the patient is is not to be taken for any other reason.

## 2013-01-02 LAB — HEMOGLOBIN A1C: Hgb A1c MFr Bld: 5.9 % (ref 4.6–6.5)

## 2013-01-02 LAB — IRON: Iron: 18 ug/dL — ABNORMAL LOW (ref 42–145)

## 2013-01-02 LAB — CBC WITH DIFFERENTIAL/PLATELET
Basophils Absolute: 0.1 10*3/uL (ref 0.0–0.1)
Eosinophils Absolute: 0.1 10*3/uL (ref 0.0–0.7)
Lymphocytes Relative: 30.8 % (ref 12.0–46.0)
MCHC: 33.3 g/dL (ref 30.0–36.0)
Neutro Abs: 4.7 10*3/uL (ref 1.4–7.7)
Neutrophils Relative %: 62.7 % (ref 43.0–77.0)
RDW: 14.9 % — ABNORMAL HIGH (ref 11.5–14.6)

## 2013-02-28 ENCOUNTER — Encounter: Payer: Self-pay | Admitting: Gynecology

## 2013-02-28 ENCOUNTER — Ambulatory Visit (INDEPENDENT_AMBULATORY_CARE_PROVIDER_SITE_OTHER): Payer: BC Managed Care – PPO | Admitting: Gynecology

## 2013-02-28 VITALS — BP 112/70

## 2013-02-28 DIAGNOSIS — Z23 Encounter for immunization: Secondary | ICD-10-CM

## 2013-02-28 DIAGNOSIS — D649 Anemia, unspecified: Secondary | ICD-10-CM

## 2013-02-28 DIAGNOSIS — Z3490 Encounter for supervision of normal pregnancy, unspecified, unspecified trimester: Secondary | ICD-10-CM

## 2013-02-28 DIAGNOSIS — N912 Amenorrhea, unspecified: Secondary | ICD-10-CM

## 2013-02-28 LAB — HCG, QUANTITATIVE, PREGNANCY: hCG, Beta Chain, Quant, S: 4583.6 m[IU]/mL

## 2013-02-28 NOTE — Progress Notes (Signed)
   37 year old now gravida 3 para 2 who presents to the office stating that her last menstrual period was 01/26/2013. Patient had been using condoms for contraception to stop several months ago since she was attempting to get pregnant. Her first pregnancy with was delivered vaginally at term in the second delivery was delivered at term via cesarean section. Patient denies any nausea or vomiting only some breast tenderness. Her PCP has had her on iron supplementation because of anemia. She is currently on prenatal vitamins.  Exam: Bartholin's urethra Skene glands within normal limits Vagina: No lesions or discharge Cervix: No lesions or discharge Uterus: 4-six-week size nontender Adnexa: No palpable mass or tenderness Rectal exam: Not done  Urine pregnancy test in the office today positive  Assessment/plan: First trimester pregnancy in this patient with advanced maternal age at the age of 37. A quantitative beta hCG will be drawn today and will be repeated in one week with a followup ultrasound in 2 weeks for viability and dating. Information on pregnancy was provided. Patient did received the flu vaccine today in accordance to the ACOG guidelines.

## 2013-02-28 NOTE — Patient Instructions (Signed)
Media planner (Pregnancy) Si planea quedar embarazada, es una buena idea concertar una cita de preconcepcin con el mdico para poder lograr un estilo de vida saludable ante de quedar embarazada. Esto incluye dieta, peso, ejercicio, el tomar vitaminas prenatales en especial cido flico (ayuda a prevenir defectos en el cerebro y la mdula espinal), evitar el alcohol, fumar, las drogas ilegales, problemas mdicos (diabetes, convulsiones), historial familiar de problemas genticos, condiciones de trabajo e inmunizaciones. Es mejor tener conocimiento de estas cosas y Field seismologist algo antes de quedar embarazada. Si est embarazada, es necesario que siga ciertas pautas para tener un beb sano. Es muy importante Optometrist controles prenatales adecuados y seguir las indicaciones del profesional que la asiste. La atencin prenatal incluye toda la asistencia mdica que usted recibe antes del nacimiento del beb. Esto ayuda a Risk analyst y [place]. INSTRUCCIONES PARA EL CUIDADO DOMICILIARIO  Comience las consultas prenatales alrededor de la 12 semana de embarazo o lo antes posible. Al principio generalmente se programan cada mes. Se hacen ms frecuentes en los 2 ltimos meses antes del parto. Es importante que concurra a todas las citas con el profesional y siga sus instrucciones con Engineer, site a los medicamentos que deba Risk manager, a la actividad fsica y a Engineer, technical sales.  Durante el embarazo debe obtener nutrientes para usted y para su beb. Consuma una dieta normal y bien balanceada. Elija alimentos como carne, pescado, [place] y otros productos lcteos, vegetales, frutas, panes integrales y Therapist, art cul es el aumento de [place] ideal, segn su peso y Chief Strategy Officer. Beba gran cantidad de lquidos. Trate de beber 8 vasos de lquidos por Training and development officer.  El alcohol se asocia a cierto nmero de defectos del nacimiento, incluyendo el sndrome de alcoholismo fetal. Lo mejor es evitarlo  completamente El cigarrillo causa nacimientos prematuros y bebs de bajo peso al Associate Professor. El consumo de alcohol y nicotina durante el embarazo tambin aumentan marcadamente la probabilidad de que el nio sea qumicamente dependiente en etapas posteriores de su vida y puede contribuir al sndrome de muerte sbita infantil (SMSI)  No consuma drogas.  Solo tome medicamentos prescriptos o de venta libre que le haya recomendado el profesional. Algunos medicamentos pueden causar problemas genticos y fsicos al beb  Las nuseas matinales pueden aliviarse si come Firefighter en la cama. Coma dos galletitas antes de levantarse por la maana.  Las relaciones sexuales pueden continuarse hasta casi el final del embarazo, si no se presentan otros problemas como prdida prematura (antes de tiempo) de lquido amnitico, Social research officer, government vaginal, dolor durante las relaciones sexuales o dolor abdominal (en el vientre).  Practique ejercicios con regularidad. Consulte con el profesional que la asiste si no sabe con certeza si determinados ejercicios son seguros.  No utilice la baera con agua caliente, baos turcos y saunas. Estos aumentan el riesgo de sufrir un desmayo o de prdida del conocimiento, y as Glass blower/designer usted o el beb. La natacin es un buen ejercicio. Descanse todo lo que pueda e incluya una siesta despus de almorzar siempre que le sea posible, especialmente durante el tercer trimestre.  Evite los olores y las sustancias qumicas txicas.  No use zapatos de tacones altos, podra perder el equilibrio y caer.  No levante objetos de ms de 2,5 kg. Si levanta un objeto, flexione las piernas y los muslos, y no la espalda.  Evite los viajes largos, Art gallery manager trimestre.  Si debe viajar fuera de la ciudad o de su [place], lleve [place]  copia de la historia clnica. SOLICITE ATENCIN MDICA DE INMEDIATO SI:  La temperatura oral se eleva sin motivo por encima de 38,9 C (102 F) o  segn le indique el profesional que lo asiste.  Tiene una prdida de lquido por la vagina. Si sospecha una ruptura de las [place], tmese la temperatura y llame al profesional para informarlo sobre esto.  Observa unas pequeas manchas o una hemorragia vaginal Notifique al profesional acerca de la cantidad y de cuntos apsitos est utilizando.  Contina teniendo nuseas y no obtiene alivio de los [employer] han [place], o vomita sangre o una sustancia similar a la borra del caf.  Presenta un dolor en la zona superior del abdomen.  Siente molestias en el ligamento redondo en la parte abdominal baja. El profesional que la asiste Hydrologist.  Siente pequeas contracciones del tero (matriz)  No siente que el beb se mueve, o percibe menos movimientos que antes.  Siente dolor al [employer].  Brett Fairy hemorragia vaginal anormal.  Tiene diarrea persistente.  Sufre una cefalea grave.  Tiene problemas visuales.  Comienza a sentir debilidad muscular.  Se siente mareada o sufre un desmayo.  Comienza a sentir falta de aire.  Siente dolor en el pecho.  Sufre dolor en la espalda que se irradia hacia la pierna y el pie.  Siente latidos cardacos irregulares o la frecuencia cardaca es muy rpida.  Aumenta excesivamente de peso en un perodo breve (2,5 kg en 3 a 5 das)  Se ve envuelta en una situacin de violencia domstica. Document Released: 12/23/2004 Document Revised: 06/07/2011 [facility] Patient Information 2014 [place], [place]. Vacuna antigripal (vacuna antigripal inactivada) 2013 2014, Lo que debe saber  (Influenza Vaccine [Flu Vaccine, Inactivated] 2013 2014, What You Need to Know) PORQU VACUNARSE?   La influenza ("gripe") es una enfermedad contagiosa que se propaga por los [place] en invierno, por lo general entre octubre y [place].  La causa de la gripe es el virus de la influenza, y se puede contagiar por la tos, al estornudar y por el contacto  cercano.  Cualquier persona puede Writer gripe, Biomedical engineer el riesgo es mayor entre los nios. Los sntomas aparecen rpidamente y pueden durar [address]. Pueden ser:  Grant Ruts o escalofros.  Dolor de Advertising copywriter.  Dolores musculares.  La fatiga.  Tos.  Dolor de [place].  Secrecin o congestin nasal. La gripe puede hacer que algunas personas se enfermen ms que otros. Entre [employer] se incluyen a los nios pequeos, las [employer] de 65 aos, las mujeres embarazadas y las personas con Runner, broadcasting/film/video, como enfermedades cardacas, pulmonares o renales, o que tienen un sistema inmunolgico debilitado. La vacuna contra la gripe es especialmente importante para estas personas y para todos los que estn en estrecho contacto con ellos.  La gripe tambin puede causar neumona y Theme park manager las afecciones existentes. En los nios, puede provocar diarrea y convulsiones.  Cada ao miles de [employer] [place] debido a la gripe y muchos ms deben ser hospitalizados.  La vacuna contra la gripe es la mejor proteccin que existe contra la gripe y sus complicaciones. La vacuna contra la gripe tambin ayuda a prevenir la propagacin de la gripe de Neomia Dear persona a Educational psychologist.  VACUNA INACTIVADA CONTRA LA GRIPE  Hay dos tipos de vacunas contra la gripe:   Usted recibir la vacuna de la gripe inactivada, que no contiene virus vivo. Se administra en forma de inyeccin con Marella Bile y se llama la "vacuna  antigripal".  Otro tipo de vacuna con virus vivos, atenuados (debilitados), se aplica en forma de aerosol en las fosas nasales. Esta vacuna se describe en el apartado Informacin sobre las vacunas. Se recomienda aplicarse la vacuna contra la gripe todos los [place]. Los nios [employer] 6 meses y los 8 aos de edad deben recibir 2 dosis Dispensing optician que se vacunen.  Los virus de la gripe [place] constantemente. Cada ao, la vacuna contra la gripe se actualiza para proteger contra los virus que  tienen ms probabilidades de causar la enfermedad ese ao. Aunque la vacuna no puede prevenir todos los casos de gripe, es nuestra mejor defensa contra la enfermedad. Vacuna contra la gripe inactivada protege contra 3 o 4 virus diferentes.  Se tarda aproximadamente 2 semanas para desarrollar la proteccin despus de la vacunacin y la proteccin dura entre algunos meses y un ao.  Muchas veces se confunden con la gripe algunas enfermedades que no son causadas por el virus de la gripe. La vacuna contra la gripe no previene estas enfermedades. Slo se puede prevenir la gripe.  Para las personas de ms de 65 aos, se dispone de una vacuna contra la gripe de "dosis elevada". La persona que aplica la vacuna puede darle ms informacin al respecto.  Algunas de las vacunas contra la gripe inactivada contienen una cantidad muy pequea de un conservante a base de mercurio llamado timerosal. Algunos estudios han demostrado que el timerosal en las vacunas no es perjudicial, pero se dispone de vacunas contra la gripe que no contienen el conservante.  ALGUNAS PERSONAS NO DEBEN RECIBIR ESTA VACUNA Informe a la persona que le aplica la vacuna:   Si sufre alguna alergia grave (que pone en peligro la vida). Si alguna vez tuvo una reaccin alrgica potencialmente mortal despus de Neomia Dear dosis de la vacuna contra la gripe, o tuvo una alergia grave a cualquiera de los componentes de [place], es posible que se le recomiende no recibir una dosis. La [employer] de las vacunas contra la gripe, aunque no todas, contienen una pequea cantidad de [place].  Si alguna vez ha sufrido el sndrome de Pension scheme manager (una enfermedad paralizante grave tambin llamada GBS). Algunas personas con antecedentes de GBS no deben recibir esta vacuna. Debe comentarlo con su mdico.  Si no se siente bien. Podran sugerirle que espere hasta sentirse mejor. Pero debe volver. RIESGOS DE UNA REACCIN A LA VACUNA Con la vacuna, como cualquier  medicamento, existe la posibilidad de sufrir efectos secundarios. Suelen ser leves y desaparecen por s solos.  Los efectos secundarios graves son [place], pero son Lynnae Sandhoff raros. Vacuna de la gripe inactivada no contiene el virus vivo de la gripe, la gripe por lo tanto enfermarse por recibir la vacuna no es posible.  Episodios de desmayo leves y sntomas relacionados (tales como sacudidas) pueden presentarse despus de cualquier procedimiento mdico, incluyendo la vacunacin. Si permanece sentado o recostado durante 15 minutos despus de la vacunacin puede ayudar a [employer] y las lesiones causadas por las cadas. Informe al mdico si se siente mareado o aturdido, tiene [employer] visin o zumbidos en los odos.  Problemas leves luego de recibir la vacuna de la gripe inactivada:   Barista, enrojecimiento o Paramedic en el que le aplicaron la vacuna.  Ronquera; dolor, inflamacin o picazn en los ojos o tos.  Grant Ruts.  Dolores.  Dolor de [place].  Picazn.  Fatiga. Si estos problemas ocurren, en general comienzan poco despus  de vacunarse y duran 1  2 das.  Problemas moderados luego de recibir la vacuna de la gripe inactivada:   Los nios que reciben la vacuna contra la gripe inactivada y Research scientist (medical) antineumoccica (PCV13) al mismo tiempo, pueden tener un mayor riesgo de sufrir convulsiones causadas por fiebre. Consulte a su mdico para obtener ms informacin. Informe a su mdico si un nio que est recibiendo la vacuna contra la gripe ha tenido una convulsin. Problemas graves luego de recibir la vacuna inactivada contra la gripe:   Neomia Dear reaccin alrgica grave puede ocurrir despus de la administracin de cualquier vacuna (se estima en menos de 1 en un milln de dosis).  Hay una pequea posibilidad de que la vacuna de la gripe inactivada est asociada con el sndrome de Guillain-Barr (GBS), no ms de 1 o 2 casos por milln de personas vacunadas. Es  Chief Operating Officer que el riesgo de sufrir complicaciones graves por la gripe, que puede prevenirse con la vacunacin. Se controla permanentemente la seguridad de las vacunas. Para obtener ms informacin, consulte [email or web address] vaccinesafety/  QU PASA SI HAY UNA REACCIN GRAVE?  Qu signos debo buscar?   Observe todo lo que le preocupe, como signos de una reaccin alrgica grave, fiebre muy alta o cambios en el comportamiento. Los signos de Runner, broadcasting/film/video grave pueden incluir urticaria, hinchazn de la cara y la garganta, dificultad para respirar, ritmo cardaco acelerado, mareos y debilidad. Pueden comenzar entre unos pocos minutos y algunas horas despus de la vacunacin.  Qu debo hacer?   Si usted piensa que se trata de una reaccin alrgica grave o de otra emergencia que no puede esperar, llame al 911 o lleve a la persona al hospital ms cercano. De lo contrario, llame a su mdico.  Despus, la reaccin debe informarse a la "Vaccine Adverse Event Reporting System" (Sistema de informacin sobre efectos adversos de las vacunas -VAERS). Su mdico puede presentar este informe, o puede hacerlo usted mismo a travs del sitio web de VAERS, en www.vaers.LAgents.no, o llamando al [phone]. VAERS es slo para informar reacciones. No brindan consejo mdico.  PROGRAMA NACIONAL DE COMPENSACIN DE DAOS POR VACUNAS  El National Vaccine Injury Compensation Program (VICP) es un programa federal que fue creado para compensar a las personas que puedan haber sufrido daos al recibir ciertas vacunas.  Aquellas personas que consideren que han sufrido un dao como consecuencia de una vacuna y quieren saber ms acerca del programa y como presentar Roslynn Amble, [place] llamar al [phone] o visitar su sitio web en SpiritualWord.at.  CMO PUEDO OBTENER MS INFORMACIN?   Consulte a su mdico.  Comunquese con el servicio de salud de su localidad o [address].  Comunquese con los Centros  para el control y la prevencin de Child psychotherapist for Disease Control and Prevention , CDC).  Llame al [phone] (1-800-CDC-INFO) o  Visite la pgina web de los CDC en BiotechRoom.com.cy. CDC Inactivated Influenza Vaccine Interim VIS (10/22/11)  Document Released: 06/11/2008 Document Revised: 12/08/2011 [employer] Patient Information 2014 [place], [place].

## 2013-03-07 ENCOUNTER — Other Ambulatory Visit: Payer: BC Managed Care – PPO

## 2013-03-07 DIAGNOSIS — N912 Amenorrhea, unspecified: Secondary | ICD-10-CM

## 2013-03-07 DIAGNOSIS — Z3490 Encounter for supervision of normal pregnancy, unspecified, unspecified trimester: Secondary | ICD-10-CM

## 2013-03-07 DIAGNOSIS — R748 Abnormal levels of other serum enzymes: Secondary | ICD-10-CM

## 2013-03-07 LAB — COMPREHENSIVE METABOLIC PANEL
ALT: 42 U/L — ABNORMAL HIGH (ref 0–35)
AST: 25 U/L (ref 0–37)
Albumin: 3.9 g/dL (ref 3.5–5.2)
Alkaline Phosphatase: 51 U/L (ref 39–117)
Calcium: 9.3 mg/dL (ref 8.4–10.5)
Chloride: 105 mEq/L (ref 96–112)
Potassium: 3.9 mEq/L (ref 3.5–5.3)
Sodium: 137 mEq/L (ref 135–145)
Total Protein: 6.7 g/dL (ref 6.0–8.3)

## 2013-03-07 LAB — COMPREHENSIVE METABOLIC PANEL WITH GFR
BUN: 15 mg/dL (ref 6–23)
CO2: 24 meq/L (ref 19–32)
Creat: 0.88 mg/dL (ref 0.50–1.10)
Glucose, Bld: 75 mg/dL (ref 70–99)
Total Bilirubin: 0.2 mg/dL — ABNORMAL LOW (ref 0.3–1.2)

## 2013-03-12 ENCOUNTER — Ambulatory Visit: Payer: BC Managed Care – PPO | Admitting: Podiatry

## 2013-03-13 ENCOUNTER — Encounter: Payer: Self-pay | Admitting: Family Medicine

## 2013-03-14 ENCOUNTER — Ambulatory Visit (INDEPENDENT_AMBULATORY_CARE_PROVIDER_SITE_OTHER): Payer: BC Managed Care – PPO | Admitting: Gynecology

## 2013-03-14 ENCOUNTER — Other Ambulatory Visit: Payer: Self-pay | Admitting: Gynecology

## 2013-03-14 ENCOUNTER — Encounter: Payer: Self-pay | Admitting: Gynecology

## 2013-03-14 ENCOUNTER — Ambulatory Visit (INDEPENDENT_AMBULATORY_CARE_PROVIDER_SITE_OTHER): Payer: BC Managed Care – PPO

## 2013-03-14 VITALS — BP 128/70

## 2013-03-14 DIAGNOSIS — O200 Threatened abortion: Secondary | ICD-10-CM

## 2013-03-14 DIAGNOSIS — N831 Corpus luteum cyst of ovary, unspecified side: Secondary | ICD-10-CM

## 2013-03-14 DIAGNOSIS — O09521 Supervision of elderly multigravida, first trimester: Secondary | ICD-10-CM

## 2013-03-14 DIAGNOSIS — N912 Amenorrhea, unspecified: Secondary | ICD-10-CM

## 2013-03-14 DIAGNOSIS — Z3490 Encounter for supervision of normal pregnancy, unspecified, unspecified trimester: Secondary | ICD-10-CM

## 2013-03-14 LAB — US OB TRANSVAGINAL

## 2013-03-14 NOTE — Progress Notes (Signed)
Patient is a 37 year old now gravida 3 para 2 AB 1 who was seen in the office on 02/28/2013. Patient's first pregnancy was delivered at term vaginally in her second pregnancy was delivered by emergency cesarean section in [place] as a result of fetal distress.  Patient's last menstrual period was reported to be 01/27/1999 [address] Her today is 6 weeks and 5 days. She had some spotting last week with some mild cramping.  Ultrasound today: Anteverted uterus with a viable intrauterine pregnancy with cardiac activity registered at 131 beats per minutes. Crown-rump length equal 7 weeks and 2 days right ovary located above the uterus with a small corpus luteum cyst left over was normal. The cervix is long and closed. A small subchorionic hematoma was noted at the right wall of the gestational sac measuring 24 x 13 x 12 mm.  Assessment/plan: First trimester pregnancy size consistent with dates. Estimated date of confinement by ultrasound 10/29/2013 and by last menstrual period 11/02/2013. Patient would normal rise in quantitative beta hCGs as follows:Results for AHILYN, NELL (MRN [id number]) as of 03/14/2013 18:00  Ref. Range 02/28/2013 09:39 03/07/2013 14:01  hCG, Beta Chain, Quant, S No range found 4583.6 32688.4   Patient with small subchorionic hematoma was instructed to refrain from strenuous activity and intercourse until after the first trimester. She will be referred to my spectacle colleagues for her pregnancy care. She is currently on prenatal vitamins.

## 2013-03-29 NOTE — L&D Delivery Note (Signed)
Attestation of Attending Supervision of Advanced Practitioner (CNM/NP): Evaluation and management procedures were performed by the Advanced Practitioner under my supervision and collaboration. I have reviewed the Advanced Practitioner's note and chart, and I agree with the management and plan.  Marji Kuehnel H. 8:56 AM   

## 2013-03-29 NOTE — L&D Delivery Note (Signed)
Delivery Note Pt with sudden pressure after vomiting and was found to be complete. She pushed with 1-2 ctx  and at 11:10 PM a viable female was delivered via Vaginal, Spontaneous Delivery (Presentation: Left Occiput Anterior).  APGAR:9 ,9 ; weight: pending.   Placenta status: Intact, Spontaneous.  Cord: 3 vessels with the following complications:  Anesthesia: Epidural  Episiotomy: none Lacerations: 1st degree perineal Suture Repair: 3.0 vicryl Est. Blood Loss (mL): 400  Mom to postpartum.  Baby to Couplet care / Skin to Skin.  Cam HaiSHAW, KIMBERLY CNM 10/12/2013, 11:34 PM

## 2013-04-03 ENCOUNTER — Other Ambulatory Visit: Payer: Self-pay | Admitting: Gynecology

## 2013-04-03 ENCOUNTER — Ambulatory Visit (INDEPENDENT_AMBULATORY_CARE_PROVIDER_SITE_OTHER): Payer: BC Managed Care – PPO

## 2013-04-03 ENCOUNTER — Ambulatory Visit (INDEPENDENT_AMBULATORY_CARE_PROVIDER_SITE_OTHER): Payer: BC Managed Care – PPO | Admitting: Gynecology

## 2013-04-03 ENCOUNTER — Encounter: Payer: Self-pay | Admitting: Gynecology

## 2013-04-03 VITALS — BP 128/86

## 2013-04-03 DIAGNOSIS — O09521 Supervision of elderly multigravida, first trimester: Secondary | ICD-10-CM

## 2013-04-03 DIAGNOSIS — N949 Unspecified condition associated with female genital organs and menstrual cycle: Secondary | ICD-10-CM

## 2013-04-03 DIAGNOSIS — Z3490 Encounter for supervision of normal pregnancy, unspecified, unspecified trimester: Secondary | ICD-10-CM

## 2013-04-03 DIAGNOSIS — O200 Threatened abortion: Secondary | ICD-10-CM

## 2013-04-03 DIAGNOSIS — N831 Corpus luteum cyst of ovary, unspecified side: Secondary | ICD-10-CM

## 2013-04-03 DIAGNOSIS — N390 Urinary tract infection, site not specified: Secondary | ICD-10-CM

## 2013-04-03 DIAGNOSIS — O09529 Supervision of elderly multigravida, unspecified trimester: Secondary | ICD-10-CM

## 2013-04-03 DIAGNOSIS — R102 Pelvic and perineal pain: Secondary | ICD-10-CM

## 2013-04-03 DIAGNOSIS — R350 Frequency of micturition: Secondary | ICD-10-CM

## 2013-04-03 LAB — US OB TRANSVAGINAL

## 2013-04-03 LAB — URINALYSIS W MICROSCOPIC + REFLEX CULTURE
Bilirubin Urine: NEGATIVE
Crystals: NONE SEEN
Glucose, UA: NEGATIVE mg/dL
Hgb urine dipstick: NEGATIVE
NITRITE: NEGATIVE
PROTEIN: NEGATIVE mg/dL
RBC / HPF: NONE SEEN RBC/hpf (ref <3)
Specific Gravity, Urine: >1.03 — ABNORMAL HIGH (ref 1.005–1.030)
Urobilinogen, UA: 0.2 mg/dL (ref 0.0–1.0)
pH: 6 (ref 5.0–8.0)

## 2013-04-03 MED ORDER — AMPICILLIN 250 MG PO CAPS: ORAL_CAPSULE | ORAL | Status: DC

## 2013-04-03 NOTE — Patient Instructions (Signed)
  Infeccin urinaria  (Urinary Tract Infection)  La infeccin urinaria puede ocurrir en cualquier lugar del tracto urinario. El tracto urinario es un sistema de drenaje del cuerpo por el que se eliminan los desechos y el exceso de agua. El tracto urinario est formado por dos riones, dos urteres, la vejiga y la uretra. Los riones son rganos que tienen forma de frijol. Cada rin tiene aproximadamente el tamao del puo. Estn situados debajo de las costillas, uno a cada lado de la columna vertebral CAUSAS  La causa de la infeccin son los microbios, que son organismos microscpicos, que incluyen hongos, virus, y bacterias. Estos organismos son tan pequeos que slo pueden verse a travs del microscopio. Las bacterias son los microorganismos que ms comnmente causan infecciones urinarias.  SNTOMAS  Los sntomas pueden variar segn la edad y el sexo del paciente y por la ubicacin de la infeccin. Los sntomas en las mujeres jvenes incluyen la necesidad frecuente e intensa de orinar y una sensacin dolorosa de ardor en la vejiga o en la uretra durante la miccin. Las mujeres y los hombres mayores podrn sentir cansancio, temblores y debilidad y sentir dolores musculares y dolor abdominal. Si tiene fiebre, puede significar que la infeccin est en los riones. Otros sntomas son dolor en la espalda o en los lados debajo de las costillas, nuseas y vmitos.  DIAGNSTICO  Para diagnosticar una infeccin urinaria, el mdico le preguntar acerca de sus sntomas. Tambin le solicitar una muestra de orina. La muestra de orina se analiza para detectar bacterias y glbulos blancos de la sangre. Los glbulos blancos se forman en el organismo para ayudar a combatir las infecciones.  TRATAMIENTO  Por lo general, las infecciones urinarias pueden tratarse con medicamentos. Debido a que la mayora de las infecciones son causadas por bacterias, por lo general pueden tratarse con antibiticos. La eleccin del  antibitico y la duracin del tratamiento depender de sus sntomas y el tipo de bacteria causante de la infeccin.  INSTRUCCIONES PARA EL CUIDADO EN EL HOGAR   Si le recetaron antibiticos, tmelos exactamente como su mdico le indique. Termine el medicamento aunque se sienta mejor despus de haber tomado slo algunos.  Beba gran cantidad de lquido para mantener la orina de tono claro o color amarillo plido.  Evite la cafena, el t y las bebidas gaseosas. Estas sustancias irritan la vejiga.  Vaciar la vejiga con frecuencia. Evite retener la orina durante largos perodos.  Vace la vejiga antes y despus de tener relaciones sexuales.  Despus de mover el intestino, las mujeres deben higienizarse la regin perineal desde adelante hacia atrs. Use slo un papel tissue por vez. SOLICITE ATENCIN MDICA SI:   Siente dolor en la espalda.  Le sube la fiebre.  Los sntomas no mejoran luego de 3 das. SOLICITE ATENCIN MDICA DE INMEDIATO SI:   Siente dolor intenso en la espalda o en la zona inferior del abdomen.  Comienza a sentir escalofros.  Tiene nuseas o vmitos.  Tiene una sensacin continua de quemazn o molestias al orinar. ASEGRESE DE QUE:   Comprende estas instrucciones.  Controlar su enfermedad.  Solicitar ayuda de inmediato si no mejora o empeora. Document Released: 12/23/2004 Document Revised: 12/08/2011 Exit[employer] Patient Information 2014 [employer].  

## 2013-04-03 NOTE — Progress Notes (Signed)
   Patient is a 38 year old gravida 4 para 2 Ab1 who was seen in the office on December 17th 2014 whereby she had an ultrasound to confirm fetal viability which was noted with an estimated gestational age is 7 weeks and 2 days. She had been complaining of some spotting and an ultrasound demonstrated a small subchorionic hematoma on the right wall of the gestational sac with a dimension of 24 x 13 x 12 mm. Her reason for visit today that she has been noticing for the past few days and left lower quadrant discomfort and urinary frequency.  Exam: Abdomen: Slightly tender in the left lower quadrant but no rebound. Pelvic: The urethra Skene was within normal limits Vagina: No blood was noted in the vaginal vault Cervix: Long closed with no blood noted Uterus: Approximately 10 week size Adnexa: Tenderness on left lower quadrant greater than right lower quadrant no discernible mass per se Rectal: Not done  Urinalysis: 11-21 wbc's, many bacteria  Ultrasound today: Viable uterine pregnancy size consistent with dates. Fetal pole and cardiac activity was noted with fetal movement. Cervix is long closed. Noted right subchorionic hematoma measuring 21 x 8 mm has decreased in size from previous scan 3 weeks ago. A right small corpus luteum cyst measuring 22 x 22 mm was noted and some free fluid was noted around the right ovary left ovary was normal.  Assessment/plan: First trimester pregnancy at 9 weeks and 5 days patient has a followup appointment with obstetrician in the next few weeks. Reassurance was provided. For her urinary tract infection she will be placed on ampicillin 250 mg one by mouth twice a day for 7 days.

## 2013-04-04 ENCOUNTER — Encounter: Payer: Self-pay | Admitting: *Deleted

## 2013-04-05 ENCOUNTER — Other Ambulatory Visit: Payer: Self-pay | Admitting: Gynecology

## 2013-04-05 DIAGNOSIS — R82998 Other abnormal findings in urine: Secondary | ICD-10-CM

## 2013-04-05 LAB — URINE CULTURE: Colony Count: >=100000

## 2013-04-09 ENCOUNTER — Ambulatory Visit: Payer: BC Managed Care – PPO

## 2013-04-09 DIAGNOSIS — R82998 Other abnormal findings in urine: Secondary | ICD-10-CM

## 2013-04-10 LAB — URINALYSIS W MICROSCOPIC + REFLEX CULTURE
Bilirubin Urine: NEGATIVE
Casts: NONE SEEN
Crystals: NONE SEEN
GLUCOSE, UA: NEGATIVE mg/dL
HGB URINE DIPSTICK: NEGATIVE
Ketones, ur: NEGATIVE mg/dL
Nitrite: NEGATIVE
PROTEIN: NEGATIVE mg/dL
Specific Gravity, Urine: 1.022 (ref 1.005–1.030)
Urobilinogen, UA: 0.2 mg/dL (ref 0.0–1.0)
pH: 7 (ref 5.0–8.0)

## 2013-04-11 ENCOUNTER — Inpatient Hospital Stay (HOSPITAL_COMMUNITY)
Admission: AD | Admit: 2013-04-11 | Discharge: 2013-04-11 | Disposition: A | Discharge status: Home / Self Care | Payer: BC Managed Care – PPO | Source: Ambulatory Visit | Attending: Obstetrics and Gynecology | Admitting: Obstetrics and Gynecology

## 2013-04-11 ENCOUNTER — Encounter (HOSPITAL_COMMUNITY): Payer: Self-pay | Admitting: *Deleted

## 2013-04-11 ENCOUNTER — Inpatient Hospital Stay (HOSPITAL_COMMUNITY): Payer: BC Managed Care – PPO

## 2013-04-11 DIAGNOSIS — O9989 Other specified diseases and conditions complicating pregnancy, childbirth and the puerperium: Principal | ICD-10-CM

## 2013-04-11 DIAGNOSIS — O9A211 Injury, poisoning and certain other consequences of external causes complicating pregnancy, first trimester: Secondary | ICD-10-CM

## 2013-04-11 DIAGNOSIS — W010XXA Fall on same level from slipping, tripping and stumbling without subsequent striking against object, initial encounter: Secondary | ICD-10-CM | POA: Insufficient documentation

## 2013-04-11 DIAGNOSIS — O99891 Other specified diseases and conditions complicating pregnancy: Secondary | ICD-10-CM | POA: Diagnosis present

## 2013-04-11 DIAGNOSIS — M549 Dorsalgia, unspecified: Secondary | ICD-10-CM | POA: Diagnosis not present

## 2013-04-11 DIAGNOSIS — Y929 Unspecified place or not applicable: Secondary | ICD-10-CM | POA: Diagnosis not present

## 2013-04-11 DIAGNOSIS — W009XXA Unspecified fall due to ice and snow, initial encounter: Secondary | ICD-10-CM

## 2013-04-11 DIAGNOSIS — Y9329 Activity, other involving ice and snow: Secondary | ICD-10-CM | POA: Insufficient documentation

## 2013-04-11 DIAGNOSIS — R109 Unspecified abdominal pain: Secondary | ICD-10-CM | POA: Diagnosis not present

## 2013-04-11 LAB — CBC
HCT: 33.8 % — ABNORMAL LOW (ref 36.0–46.0)
HEMOGLOBIN: 11.5 g/dL — AB (ref 12.0–15.0)
MCH: 27.6 pg (ref 26.0–34.0)
MCHC: 34 g/dL (ref 30.0–36.0)
MCV: 81.3 fL (ref 78.0–100.0)
Platelets: 217 10*3/uL (ref 150–400)
RBC: 4.16 MIL/uL (ref 3.87–5.11)
RDW: 14.8 % (ref 11.5–15.5)
WBC: 12.3 10*3/uL — ABNORMAL HIGH (ref 4.0–10.5)

## 2013-04-11 LAB — ABO/RH: ABO/RH(D): A POS

## 2013-04-11 MED ORDER — OXYCODONE-ACETAMINOPHEN 5-325 MG PO TABS: 1.0000 | ORAL_TABLET | Freq: Four times a day (QID) | ORAL | Status: DC | PRN

## 2013-04-11 MED ORDER — OXYCODONE-ACETAMINOPHEN 5-325 MG PO TABS
1.0000 | ORAL_TABLET | ORAL | Status: AC
  Administered 2013-04-11: 1 via ORAL
  Filled 2013-04-11: qty 1

## 2013-04-11 NOTE — MAU Note (Signed)
Pt presents with complaints of falling on ice this morning and was advised to come and get evaluated. Pt states she fell and hit the lower right side of her back.

## 2013-04-11 NOTE — Discharge Instructions (Signed)
Injuries In Pregnancy °Trauma is the most common cause of injury and death in pregnant women. The most common cause of death to the fetus is injury and death of the pregnant mother.  °Minor falls and minor automobile accidents do not usually harm the fetus. The fetus is protected in the womb by a sac filled with fluid. The fetus can be harmed if there is direct trauma to your abdomen and pelvis. Direct trauma to the uterus and placenta can affect the blood supply to the fetus. Major trauma causing significant bleeding and shock to the mother can also compromise and jeopardize the fetal blood supply. °It is important to know your blood type and the father's blood type in case you develop vaginal bleeding. If you are RH negative and have sustained serious trauma or develop vaginal bleeding, you will need to have medicine (RhoGAM [Rh immune globulin]) to avoid Rh problems in future pregnancies. °CAUSES °· Falls are more common in the second and third trimester of the pregnancy. Factors that increase your risk of falling include: °· Increase in weight. °· The change of your center of gravity. °· Tripping over an object that cannot be seen. °· Automobile accidents. It is important to wear a seat belt and always practice safe driving. °· Domestic violence or assault. Dial your local emergency services (911 in the [place]). Spousal abuse can be a significant cause of trauma during pregnancy. °· Burns (fire or electrical). Avoid fires, starting fires, lifting heavy pots of boiling or hot liquids, and fixing electrical problems. °The most common causes of death to the pregnant woman include: °· Injuries that cause severe bleeding, shock and loss of blood flow to the mother's major organs. °· Head and neck injuries that result in severe brain or spinal damage. °· Chest trauma that can cause direct injury to the heart and lungs or any injury that effects the area enclosed by the ribs (thorax). Trauma to this area can result in  cardio-respiratory arrest. °Symptoms and treatment will depend on the type of injury. °HOME CARE INSTRUCTIONS  °· Call your caregiver if you are in a car accident, even if you think you and the baby are not hurt. Your caregiver may want you to have a precautionary evaluation. °· Do not take aspirin. It can worsen bleeding. °· You may apply cold packs 3 to 4 times a day to the injury with your caregiver's permission. °· After 24 hours apply warm compresses to the injured site with your caregiver's permission. °· Call your caregiver if you are having increasing pain in any part of your body that is not remedied by your instructed home care. °· In a severe injury, try to have someone be with you and help you until you are able to take care of yourself. °· Do not wear high heel shoes while pregnant. °· Remove slippery rugs and loose objects on the floor. °SEEK IMMEDIATE MEDICAL CARE IF:  °· You have been assaulted (domestic or otherwise). °· You have been in a car accident. °· You develop vaginal bleeding. °· You develop fluid leaking from the vagina. °· You develop uterine contractions (pelvic cramping or pain). °· You develop neck stiffness or pain. °· You become weak or faint, or have uncontrolled vomiting after trauma. °· You had a serious burn. This includes burns to the face, neck, hands or genitals, or burns greater than the size of your palm anywhere else. °· You develop a headache or vision problems after a fall or from   other trauma. °· You do not feel the baby moving or the baby is not moving as much as before. °Document Released: 04/22/2004 Document Revised: 06/07/2011 Document Reviewed: 12/20/2012 °ExitCare® Patient Information ©2014 [employer]. ° °

## 2013-04-11 NOTE — MAU Provider Note (Signed)
Chief Complaint: Fall   First Provider Initiated Contact with Patient 04/11/13 1359     SUBJECTIVE HPI: Sheila Foster is a 38 y.o. G3P2 at 7019w4d by LMP who presents to maternity admissions reporting fall in her driveway today on the ice.  She reports landing on her elbows, bottom, and back.  She reports some abdominal cramping and back pain since the fall, denies vaginal bleeding, dizziness, or fever/chills.  Past Medical History  Diagnosis Date  . Constipation   . Shingles 2010  . Contraception     condoms    Past Surgical History  Procedure Laterality Date  . Cesarean section      x1   History   Social History  . Marital Status: Married    Spouse Name: N/A    Number of Children: 2  . Years of Education: N/A   Occupational History  . housekeeper     Social History Main Topics  . Smoking status: Never Smoker   . Smokeless tobacco: Never Used  . Alcohol Use: No  . Drug Use: No  . Sexual Activity: Yes    Birth Control/ Protection: Condom   Other Topics Concern  . Not on file   Social History Narrative   Education 9th grade   Original from [place]   No current facility-administered medications on file prior to encounter.   Current Outpatient Prescriptions on File Prior to Encounter  Medication Sig Dispense Refill  . ampicillin (PRINCIPEN) 250 MG capsule Take one tablet BID for 7 days  14 capsule  0  . Prenatal Vit-Fe Fumarate-FA (PRENATAL MULTIVITAMIN) TABS tablet Take 1 tablet by mouth daily at 12 noon.       No Known Allergies  ROS: Pertinent items in HPI  OBJECTIVE Blood pressure 105/87, pulse 101, temperature 98.1 F (36.7 C), resp. rate 16, height 5\' 5"  (1.651 m), weight 73.029 kg (161 lb), last menstrual period 01/26/2013. GENERAL: Well-developed, well-nourished female in no acute distress.  HEENT: Normocephalic HEART: normal rate RESP: normal effort ABDOMEN: Soft, non-tender EXTREMITIES: Nontender, no edema, no visible abrasions, normal  ROM NEURO: Alert and oriented  Visual inspection of pt left buttock reveals 4-5 cm slightly raised hematoma, tender to palpation   LAB RESULTS Results for orders placed during the hospital encounter of 04/11/13 (from the past 24 hour(s))  CBC     Status: Abnormal   Collection Time    04/11/13  1:56 PM      Result Value Range   WBC 12.3 (*) 4.0 - 10.5 K/uL   RBC 4.16  3.87 - 5.11 MIL/uL   Hemoglobin 11.5 (*) 12.0 - 15.0 g/dL   HCT 13.033.8 (*) 86.536.0 - 78.446.0 %   MCV 81.3  78.0 - 100.0 fL   MCH 27.6  26.0 - 34.0 pg   MCHC 34.0  30.0 - 36.0 g/dL   RDW 69.614.8  29.511.5 - 28.415.5 %   Platelets 217  150 - 400 K/uL  ABO/RH     Status: None   Collection Time    04/11/13  1:56 PM      Result Value Range   ABO/RH(D) A POS      IMAGING [place] Ob Comp Less 14 Wks  04/11/2013   CLINICAL DATA:  Abdominal and pelvic pain. Recent fall with abdominal trauma. Gestational age by LMP of ten weeks 5 days.  EXAM: OBSTETRIC <14 WK ULTRASOUND  TECHNIQUE: Transabdominal ultrasound was performed for evaluation of the gestation as well as the maternal uterus and adnexal  regions.  COMPARISON:  None.  FINDINGS: Intrauterine gestational sac: Visualized/normal in shape.  Yolk sac:  Visualized  Embryo:  Visualized  Cardiac Activity: Visualized  Heart Rate: 170 bpm  CRL:   47  mm   11 w 4 d                  [place] EDC: 10/27/2013  Maternal uterus/adnexae: Small subchorionic hemorrhage noted. Both ovaries are normal in appearance. No adnexal mass or free fluid identified.  IMPRESSION: Single living IUP measuring 11 weeks 4 days with [place] EDC of 10/27/2013.  Small subchorionic hemorrhage noted.   Electronically Signed   By: Myles Rosenthal M.D.   On: 04/11/2013 15:35    ASSESSMENT 1. Traumatic injury during pregnancy in first trimester   2. Fall due to ice or snow     PLAN Percocet 5/325 x2 tabs in MAU Discharge home Percocet 5/325, take 1-2 tabs Q 6 hours PRN x10 tabs Keep scheduled prenatal appointment in [facility] Return to MAU as  needed    Medication List         ampicillin 250 MG capsule  Commonly known as:  PRINCIPEN  Take one tablet BID for 7 days     oxyCODONE-acetaminophen 5-325 MG per tablet  Commonly known as:  PERCOCET/ROXICET  Take 1-2 tablets by mouth every 6 (six) hours as needed for severe pain.     prenatal multivitamin Tabs tablet  Take 1 tablet by mouth daily at 12 noon.           Follow-up Information   Follow up with [facility]. (As scheduled. Return to MAU as needed.)    Specialty:  Obstetrics and Gynecology   Contact information:   [address] [place] [address] [phone]      Sharen Counter Certified Nurse-Midwife 04/11/2013  4:12 PM

## 2013-04-12 ENCOUNTER — Other Ambulatory Visit: Payer: Self-pay | Admitting: Gynecology

## 2013-04-12 LAB — URINE CULTURE

## 2013-04-12 MED ORDER — NITROFURANTOIN MONOHYD MACRO 100 MG PO CAPS: 100.0000 mg | ORAL_CAPSULE | Freq: Two times a day (BID) | ORAL | Status: DC

## 2013-04-18 ENCOUNTER — Ambulatory Visit (INDEPENDENT_AMBULATORY_CARE_PROVIDER_SITE_OTHER): Payer: BC Managed Care – PPO | Admitting: Obstetrics and Gynecology

## 2013-04-18 ENCOUNTER — Encounter: Payer: Self-pay | Admitting: Obstetrics and Gynecology

## 2013-04-18 VITALS — BP 128/70 | Temp 97.8°F | Wt 159.9 lb

## 2013-04-18 DIAGNOSIS — R945 Abnormal results of liver function studies: Secondary | ICD-10-CM

## 2013-04-18 DIAGNOSIS — O34219 Maternal care for unspecified type scar from previous cesarean delivery: Secondary | ICD-10-CM

## 2013-04-18 DIAGNOSIS — O09529 Supervision of elderly multigravida, unspecified trimester: Secondary | ICD-10-CM

## 2013-04-18 DIAGNOSIS — R7989 Other specified abnormal findings of blood chemistry: Secondary | ICD-10-CM

## 2013-04-18 LAB — POCT URINALYSIS DIP (DEVICE)
Bilirubin Urine: NEGATIVE
Glucose, UA: NEGATIVE mg/dL
Hgb urine dipstick: NEGATIVE
Nitrite: NEGATIVE
PROTEIN: NEGATIVE mg/dL
Specific Gravity, Urine: 1.02 (ref 1.005–1.030)
UROBILINOGEN UA: 0.2 mg/dL (ref 0.0–1.0)
pH: 7 (ref 5.0–8.0)

## 2013-04-18 LAB — HEPATITIS PANEL, ACUTE
HCV Ab: NEGATIVE
Hep A IgM: NONREACTIVE
Hep B C IgM: NONREACTIVE
Hepatitis B Surface Ag: NEGATIVE

## 2013-04-18 NOTE — Progress Notes (Signed)
Records from Dr. Lily PeerFernandez pending: had pelvic and US at 10 wks in office; labs pending. Early 1 hr glucose  Subjective:    Sheila Foster is a [id number] 6445w4d being seen today for her first obstetrical visit.  Her obstetrical history is significant for prior C/S, elev LFTs, AMA. Patient does intend to breast feed. Pregnancy history fully reviewed.  Patient reports headache. Frontal headaches persistent, daily and Tylenol not helping. Slight nausea, no vomiting.  [facility]   Filed Vitals:   04/18/13 0820  BP: 128/70  Temp: 97.8 F (36.6 C)  Weight: 159 lb 14.4 oz (72.53 kg)    HISTORY: OB History  Gravida Para Term Preterm AB SAB TAB Ectopic Multiple Living  3 2 2       2     # Outcome Date GA Lbr Len/2nd Weight Sex Delivery Anes PTL Lv  3 CUR           2 TRM 06/14/02 1277w0d  8 lb (3.629 kg) M LTCS EPI  Y     Comments: decel  1 TRM 06/29/96 7544w0d  6 lb 8 oz (2.948 kg) M SVD EPI  Y     Past Medical History  Diagnosis Date  . Constipation   . Shingles 2010  . Contraception     condoms    Past Surgical History  Procedure Laterality Date  . Cesarean section      x1   Family History  Problem Relation Age of Onset  . Heart disease Father     MI age 36  . Hypertension Father   . Diabetes Father   . Colon cancer Neg Hx   . Breast cancer Neg Hx   . Uterine cancer Other     GM     Exam    Uterus:   S=D  Pelvic Exam: See Dr. Lily PeerFernandez note 04/07/13                              System: Breast:  normal appearance, no masses or tenderness, Inspection negative   Skin: normal coloration and turgor, no rashes    Neurologic: oriented, normal, grossly non-focal   Extremities: normal strength, tone, and muscle mass, no deformities   HEENT PERRLA and extra ocular movement intact   Mouth/Teeth mucous membranes moist, pharynx normal without lesions and dental hygiene good   Neck supple and no masses   Cardiovascular: regular rate and rhythm, no  murmurs or gallops   Respiratory:  appears well, vitals normal, no respiratory distress, acyanotic, normal RR, ear and throat exam is normal, neck free of mass or lymphadenopathy, chest clear, no wheezing, crepitations, rhonchi, normal symmetric air entry   Abdomen: soft, non-tender; bowel sounds normal; no masses,  no organomegaly No hepatomegaly or tenderness     Assessment:    Pregnancy: [id number] Patient Active Problem List   Diagnosis Date Noted  . AMA (advanced maternal age) multigravida 36+ 04/18/2013  . Previous cesarean section complicating pregnancy 04/18/2013  . Pregnant 04/03/2013  . Shingles 01/01/2013  . Anemia 01/01/2013  . Cough 08/03/2012  . Elevated LFTs 08/03/2012  . Prediabetes 08/03/2012  . Family history of type II diabetes mellitus 05/15/2012  . Mastodynia, female 05/15/2012  . OAB (overactive bladder) 05/15/2012  . ESOPHAGEAL REFLUX 11/14/2008  Headaches in pregnancy      Plan:    Refer to Jannifer RodneyLinda Barefoot, NP re: H/A's. Advised no Tylenol  products. Increase fluids. Stress reduction. Initial labs : records from [facility]D. Early 1hr glucola and acute hep panel sent Prenatal vitamins. Problem list reviewed and updated. Genetic Screening discussed Quad Screen: requested.  Ultrasound discussed; fetal survey: requested.  Follow up in 3 weeks. 50% of 30 min visit spent on counseling and coordination of care.  Interested in [facility] consent given for review   Littie Chiem 04/18/2013

## 2013-04-18 NOTE — Progress Notes (Signed)
Pulse- 94  Pain-sharp pain btwn groin, ligament pain New ob packet given Flu vaccine given at Dr. Lily PeerFernandez office Weight gain of 25-35lb

## 2013-04-18 NOTE — Patient Instructions (Signed)
Embarazo  Primer trimestre  (Pregnancy - First Trimester)  Durante el acto sexual, millones de espermatozoides entran en la vagina. Slo 1 espermatozoide penetra y fertiliza al vulo mientras se encuentra en la trompa de [place]. Una semana ms tarde, el vulo fertilizado se implanta en la pared del tero. Un embrin comienza a desarrollarse para ser un beb. A las 6 a 8 semanas se forman los ojos y la cara y los latidos del corazn se pueden ver en la ecografa. Al final de las 12 semanas (primer trimestre) todos los rganos del beb estn formados. Ahora que est embarazada, querr hacer todo lo que est a su alcance para tener un beb sano. Dos de las cosas ms importantes son: tener una buena atencin prenatal y seguir las indicaciones del profesional que la asiste. La atencin prenatal incluye toda la asistencia mdica que usted recibe antes del nacimiento del beb. Se lleva a cabo para prevenir y tratar problemas durante el embarazo y el parto. EXAMENES PRENATALES   Durante las visitas prenatales se controlan el peso, la presin arterial y se solicitan anlisis de orina. Esto se hace para asegurarse de que usted est sana y el embarazo progrese normalmente.  Una mujer embarazada debe aumentar de 25 a 35 libras durante el embarazo. Sin embargo, si usted tiene sobrepeso o bajo peso, su mdico le aconsejar qu hacer.  El podr hacerle preguntas y responder todas las que usted le haga.  Durante los exmenes prenatales se solicitan anlisis de sangre, cultivos del tero, un [place] y otros anlisis necesarios. Estas pruebas se realizan para controlar su salud y la del beb. Se recomienda que se haga la prueba para el diagnstico del VIH, con su autorizacin. Este es el virus que causa el SIDA. Estas pruebas se realizan porque existen medicamentos que podran administrarle para prevenir que el beb nazca con esta infeccin, si usted estuviera infectada y no lo supiera. Los anlisis de sangre  tambin se realizan para determinar el tipo de sangre, si tuvo infecciones previas y controlar sus niveles en la sangre (hemoglobina).  Tener un recuento bajo de hemoglobina (anemia) es comn durante el embarazo. Para prevenirla, se administran hierro y vitaminas. En una etapa ms avanzada del embarazo, le indicarn exmenes de sangre para saber si tiene diabetes, junto con otros anlisis, en caso de que tuviera problemas.  Es necesario que se haga las pruebas para asegurarse de que usted y el beb estn bien. CAMBIOS DURANTE EL PRIMER TRIMESTRE  Su organismo atravesar numerosos cambios durante el embarazo. Estos pueden variar de una persona a otra. Converse con el mdico acerca los cambios que usted nota y que la preocupan. Ellos son:   El perodo menstrual se detiene.  El vulo y los espermatozoides llevan los genes que determinan cmo seremos. Sus genes y los de su pareja forman el beb. Los genes del varn determinan si ser un nio o una nia.  La circunferencia de la cintura va a ir aumentando y podr sentirse hinchada.  Puede tener malestar estomacal (nuseas) y vmitos. Si no puede controlar los vmitos, consulte a su mdico.  Sus mamas comenzarn a agrandarse y sensibilizarse.  Los pezones pueden sobresalir ms y ser ms oscuros.  Tendr necesidad de orinar ms. El dolor al orinar puede significar que usted tiene una infeccin de la vejiga.  Se cansar con facilidad.  Prdida del apetito.  Sentir un fuerte deseo de consumir ciertos alimentos.  Al principio, usted puede ganar o perder un par de kilos.    Podr tener cambios emocionales de un da a otro (entusiasmo por estar embarazada o preocupacin por el embarazo y el beb).  Tendr sueos ms vvidos y extraos. INSTRUCCIONES PARA EL CUIDADO EN EL HOGAR   Es muy importante evitar el cigarrillo, el alcohol y los frmacos no recetados durante el embarazo. Estas sustancias afectan la formacin y el desarrollo del beb.  Evite los productos qumicos durante el embarazo para asegurar la salud del beb.  Comience las consultas prenatales alrededor de la 12 semana de embarazo. Generalmente se programan cada mes al principio y se hacen ms frecuentes en los 2 ltimos meses antes del parto. Cumpla con las citas de control. Siga las indicaciones del mdico con respecto al uso de medicamentos, los anlisis y pruebas de laboratorio, los ejercicios y la dieta.  Durante el embarazo debe obtener nutrientes para usted y para su beb. Consuma alimentos balanceados. Elija alimentos como carne, pescado, leche y otros productos lcteos descremados, vegetales, frutas, panes integrales y cereales. El mdico le informar cul es el aumento de peso ideal.  Las nuseas matinales pueden aliviarse si come algunas galletitas saladas en la cama. Coma dos galletitas antes de levantarse por la maana. Tambin puede comer galletitas sin sal.  Hacer 4 o 5 comidas pequeas en lugar de 3 comidas grandes por da tambin puede aliviar las nuseas y los vmitos.  Beber lquidos entre las comidas en lugar de tomarlos durante las comidas tambin puede ayudar a calmar las nuseas y los vmitos.  Puede continuar teniendo relaciones sexuales durante todo el embarazo si no hay otros problemas. Los problemas pueden ser una prdida precoz (prematura) de lquido amnitico, sangrado vaginal, o dolor en el vientre (abdominal).  Realice actividad fsica todos los das, si no tiene restricciones. Consulte con su mdico o terapeuta fsico si no est segura de algunos de sus ejercicios. El mayor aumento de peso se producir en los ltimos 2 trimestres del embarazo. El ejercicio le ayudar a:  Controlar su peso.  Mantenerse en forma.  Prepararse para el parto.  La ayudar a perder el peso del embarazo despus de que nazca su beb.  Use un buen sostn o como los que se usan para hacer deportes para aliviar la sensibilidad de las mamas. Tambin puede serle  til si lo usa mientras duerme.  Consulte cuando puede comenzar con las clases de pre parto. Comience con las clases cuando estn disponibles.  No utilice la baera con agua caliente, baos turcos y saunas.   Colquese el cinturn de seguridad cuando conduzca. Este la proteger a usted y al beb en caso de accidente.  Evite comer carne cruda y el contacto con los utensilios y desperdicios de los gatos. Estos elementos contienen grmenes que pueden causar defectos de nacimiento en el beb.  El primer trimestre es un buen momento para visitar a su dentista y evaluar su salud dental. Es importante mantener los dientes limpios. Use un cepillo de dientes suave y cepllese con ms suavidad durante el embarazo.  Pida ayuda si tienen necesidades financieras, teraputicas o nutricionales. El profesional podr ayudarla con respecto a estas necesidades, o derivarla a otros especialistas.  No tome medicamentos o hierbas excepto aquellos que le indic el profesional.  Informe a su mdico si sufre violencia familiar mental o fsica.  Haga una lista de nmeros de telfono de emergencia de la familia, los amigos, el hospital y los departamentos de polica y bomberos.  Escriba sus preguntas. Llvelas cuando concurra a su visita prenatal.  No se   haga duchas vaginales.  No cruce las piernas.  Si usted tiene que estar parada por largos perodos de tiempo, gire los pies o de pequeos pasos en crculo.  Es posible que tenga ms secreciones vaginales que puedan requerir una toalla higinica. No use tampones o toallas higinicas perfumadas. EL CONSUMO DE MEDICAMENTOS Y FRMACOS DURANTE EL EMBARAZO   Tome las vitaminas para la etapa prenatal tal como se le indic. Las vitaminas deben contener un miligramo de cido flico. Guarde todas las vitaminas fuera del alcance de los nios. La ingestin de slo un par de vitaminas o tabletas que contengan hierro pueden ocasionar la muerte en un beb o en un nio  pequeo.  Evite el uso de todos los medicamentos, incluyendo hierbas, medicamentos de venta libre, sin receta o que no hayan sido sugeridos por su mdico. Slo tome medicamentos de venta libre o medicamentos recetados para el dolor, el malestar o fiebre como lo indique su mdico. No tome aspirina, ibuprofeno o naproxeno excepto que su mdico se lo indique.  Infrmele al profesional si consume medicamentos de hierbas.  El alcohol se relaciona con ciertos defectos congnitos. Incluye el sndrome de alcoholismo fetal. Debe evitar absolutamente el consumo de alcohol, en cualquier forma. El fumar causar baja tasa de natalidad y bebs prematuros.  Las drogas ilegales o de la calle son muy perjudiciales para el beb. Estn absolutamente prohibidas. Un beb que nace de una madre adicta, ser adicto al nacer. Ese beb tendr los mismos sntomas de abstinencia que un adulto.  Informe a su mdico acerca de los medicamentos que ha tomado y el motivo por el que los tom. SOLICITE ATENCIN MDICA SI:  Tiene preguntas o preocupaciones relacionadas con el embarazo. Es mejor que llame para formular las preguntas si no puede esperar hasta la prxima visita, que sentirse preocupada por ellas.  SOLICITE ATENCIN MDICA DE INMEDIATO SI:   La temperatura oral le sube a ms de 38,9 C (102 F) o lo que su mdico le indique.  Tiene una prdida de lquido por la vagina (canal de parto). Si sospecha una ruptura de las membranas, tmese la temperatura y llame al profesional para informarlo sobre esto.  Observa unas pequeas manchas o una hemorragia vaginal. Notifique al profesional acerca de la cantidad y de cuntos apsitos est utilizando.  Presenta un olor desagradable en la secrecin vaginal y observa un cambio en el color.  Contina con las nuseas y no obtiene alivio de los remedios indicados. Vomita sangre o algo similar a la borra del caf.  Pierde ms de 2 libras (1 Kg) en una semana.  Aumenta ms de 1 Kg  en una semana y nota el rostro, las manos, los pies o las piernas hinchados.  Aumenta ms de 2,5 Kg en una semana (aunque no tenga las manos, pies, piernas o el rostro hinchados).  Ha estado expuesta a la rubola y no ha sufrido la enfermedad.  Ha estado expuesta a la quinta enfermedad o a la varicela.  Siente dolor en el vientre (abdominal). Las molestias en el ligamento redondo son una causa benigna frecuente de dolor abdominal durante el embarazo. El profesional que la asiste deber evaluarla.  Presenta dolor de cabeza, fiebre, diarrea, dolor al orinar o le falta la respiracin.  Se cae, se ve involucrada en un accidente automovilstico o sufre algn tipo de traumatismo.  En su hogar hay violencia mental o fsica. Document Released: 12/23/2004 Document Revised: 12/08/2011 [employer] Patient Information 2014 [employer].  

## 2013-04-19 LAB — PRESCRIPTION MONITORING PROFILE (19 PANEL)
Amphetamine/Meth: NEGATIVE ng/mL
Barbiturate Screen, Urine: NEGATIVE ng/mL
Benzodiazepine Screen, Urine: NEGATIVE ng/mL
Buprenorphine, Urine: NEGATIVE ng/mL
CREATININE, URINE: 109.65 mg/dL (ref >20.0)
Cannabinoid Scrn, Ur: NEGATIVE ng/mL
Carisoprodol, Urine: NEGATIVE ng/mL
Cocaine Metabolites: NEGATIVE ng/mL
FENTANYL URINE: NEGATIVE ng/mL
MDMA URINE: NEGATIVE ng/mL
MEPERIDINE UR: NEGATIVE ng/mL
Methadone Screen, Urine: NEGATIVE ng/mL
Methaqualone: NEGATIVE ng/mL
Nitrites, Initial: NEGATIVE ug/mL
OPIATE SCREEN, URINE: NEGATIVE ng/mL
OXYCODONE SCRN UR: NEGATIVE ng/mL
PH URINE, INITIAL: 7 pH (ref 4.5–8.9)
PROPOXYPHENE: NEGATIVE ng/mL
Phencyclidine, Ur: NEGATIVE ng/mL
TRAMADOL UR: NEGATIVE ng/mL
Tapentadol, urine: NEGATIVE ng/mL
ZOLPIDEM, URINE: NEGATIVE ng/mL

## 2013-04-19 LAB — GLUCOSE TOLERANCE, 1 HOUR (50G) W/O FASTING: Glucose, 1 Hour GTT: 108 mg/dL (ref 70–140)

## 2013-04-19 NOTE — MAU Provider Note (Signed)
Attestation of Attending Supervision of Advanced Practitioner (CNM/NP): Evaluation and management procedures were performed by the Advanced Practitioner under my supervision and collaboration.  I have reviewed the Advanced Practitioner's note and chart, and I agree with the management and plan.  Jerrold Haskell 04/19/2013 8:27 AM

## 2013-04-20 LAB — CULTURE, OB URINE
Colony Count: NO GROWTH
Organism ID, Bacteria: NO GROWTH

## 2013-04-24 ENCOUNTER — Encounter: Payer: Self-pay | Admitting: Nurse Practitioner

## 2013-04-24 ENCOUNTER — Ambulatory Visit (INDEPENDENT_AMBULATORY_CARE_PROVIDER_SITE_OTHER): Payer: BC Managed Care – PPO | Admitting: Nurse Practitioner

## 2013-04-24 VITALS — BP 100/69 | HR 84 | Ht 64.0 in | Wt 161.4 lb

## 2013-04-24 DIAGNOSIS — F419 Anxiety disorder, unspecified: Secondary | ICD-10-CM | POA: Insufficient documentation

## 2013-04-24 DIAGNOSIS — F411 Generalized anxiety disorder: Secondary | ICD-10-CM

## 2013-04-24 DIAGNOSIS — G43009 Migraine without aura, not intractable, without status migrainosus: Secondary | ICD-10-CM | POA: Insufficient documentation

## 2013-04-24 DIAGNOSIS — O09529 Supervision of elderly multigravida, unspecified trimester: Secondary | ICD-10-CM

## 2013-04-24 MED ORDER — HYDROXYZINE HCL 25 MG PO TABS: 25.0000 mg | ORAL_TABLET | Freq: Every evening | ORAL | Status: DC | PRN

## 2013-04-24 NOTE — Progress Notes (Signed)
Diagnosis: Migraine without aura, pregnancy, anxiety  History: Sheila Foster 38 y.o. [id number] [facility] work[facility]. She only remembers having an occasional  headache until about 4-5 years ago when she started getting them more often. At the same time she had Shingles and admits she was very stressed . This headache cycle started when she fell on the ice and had some vaginal bleeding. She did not hit her head, but was very frightened about the baby. Since then she has had a daily migraine. The pain is about 5 on 1/10 scale. She admits to anxiety and insomnia. She has trouble falling asleep and staying asleep.    Location: Bilateral temples/ can move to occipital areas  Number of Headache days/month: Severe:0 Moderate: 14 / has been daily x 2 weeks Mild:0  Current Outpatient Prescriptions on File Prior to Visit  Medication Sig Dispense Refill  . Prenatal Vit-Fe Fumarate-FA (PRENATAL MULTIVITAMIN) TABS tablet Take 1 tablet by mouth daily at 12 noon.       No current facility-administered medications on file prior to visit.    Acute prevention: tylenol  Past Medical History  Diagnosis Date  . Constipation   . Shingles 2010  . Contraception     condoms    Past Surgical History  Procedure Laterality Date  . Cesarean section      x1   Family History  Problem Relation Age of Onset  . Heart disease Father     MI age 36  . Hypertension Father   . Diabetes Father   . Colon cancer Neg Hx   . Breast cancer Neg Hx   . Uterine cancer Other     GM   Social History:  reports that she has never smoked. She has never used smokeless tobacco. She reports that she does not drink alcohol or use illicit drugs. Allergies: No Known Allergies  Triggers: stress  Birth control: pregnancy  ROS: positive for migraines, pregnancy, anxiety, insomnia, nausea, dizzy  Exam: Well developed, well nourished Hispanic female  General: NAD HEENT:  Negative Cardiac: RRR Lungs:Clear Neuro:Neagtive Skin:warma and dry  Procedure: 4cc lidocaine, 4 cc marcaine.  Injected with 1cc each site in Bil Occipital, Bil Temple and bilateral traps. Pt tolerated procedure well. Advised to ice   Impression:migraine - common  Plan: Discussed the pathophysiology of migraine and risks of medications in pregnancy with interpreter Sheila Foster. She was willing to have trigger point injections and that was helpful. She will use Vistaril at bedtime for sleep and to help with anxiety. Hopefully we can break the cycle with the TPI's today. She will call Sheila Foster in one week and give her an update on her headache.   Time Spent: 45 minutes

## 2013-04-24 NOTE — Patient Instructions (Signed)
Cefalea migrañosa  (Migraine Headache)  Una cefalea migrañosa es un dolor intenso y punzante en uno o ambos lados de la cabeza. La migraña puede durar desde 30 minutos hasta varias horas.  CAUSAS   No siempre se conoce la causa exacta de la cefalea migrañosa. Sin embargo, pueden aparecer cuando los nervios del cerebro se irritan y liberan ciertas sustancias químicas que causan inflamación. Esto ocasiona dolor.  Existen también ciertos factores que pueden desencadenar las migrañas, como los siguientes:  · Alcohol.  · Fumar.  · Estrés.  · La menstruación  · Quesos añejados.  · Los alimentos o las bebidas que contienen nitratos, glutamato, aspartamo o tiramina.  · Falta de sueño.  · Chocolate.  · Cafeína.  · Hambre.  · Actividad física extenuante.  · Fatiga.  · Medicamentos que se usan para tratar el dolor en el pecho (nitroglicerina), píldoras anticonceptivas, estrógeno y algunos medicamentos para la hipertensión arterial.  SIGNOS Y SÍNTOMAS  · Dolor en uno o ambos lados de la cabeza.  · Dolor pulsante o punzante.  · Dolor intenso que impide realizar las actividades diarias.  · Dolor que se agrava por cualquier actividad física.  · Náuseas, vómitos o ambos.  · Mareos.  · Dolor con la exposición a las luces brillantes, a los ruidos fuertes o la actividad.  · Sensibilidad general a las luces brillantes, a los ruidos fuertes o a los olores.  Antes de sufrir una migraña, puede recibir señales de advertencia (aura). Un aura puede incluir:  · Ver las luces intermitentes.  · Ver puntos brillantes, halos o líneas en zigzag.  · Tener una visión en túnel o visión borrosa.  · Sensación de entumecimiento u hormigueo.  · Dificultad para hablar  · Debilidad muscular.  DIAGNÓSTICO   La cefalea migrañosa se diagnostica en función de lo siguiente:  · Síntomas.  · Examen físico.  · Una TC (tomografía computada) o resonancia magnética de la cabeza. Estas pruebas de diagnóstico por imagen no pueden diagnosticar las migrañas, pero pueden  ayudar a descartar otras causas de las cefaleas.  TRATAMIENTO  Le prescribirán medicamentos para aliviar el dolor y las náuseas. También podrán administrarse medicamentos para ayudar a prevenir las migrañas recurrentes.   INSTRUCCIONES PARA EL CUIDADO EN EL HOGAR  · Sólo tome medicamentos de venta libre o recetados para calmar el dolor o el malestar, según las indicaciones de su médico. No se recomienda usar los opiáceos a largo plazo.  · Cuando tenga la migraña, acuéstese en un cuarto oscuro y tranquilo  · Lleve un registro diario para averiguar lo que puede provocar las cefaleas migrañosas. Por ejemplo, escriba:  · Lo que usted come y bebe.  · Cuánto tiempo duerme.  · Algún cambio en su dieta o en los medicamentos.  · Limite el consumo de bebidas alcohólicas.  · Si fuma, deje de hacerlo.  · Duerma entre 7 y 9 horas, o según las recomendaciones del médico.  · Limite el estrés.  · Mantenga las luces tenues si le molestan las luces brillantes y la migraña empeora.  SOLICITE ATENCIÓN MÉDICA DE INMEDIATO SI:   · La migraña se hace cada vez más intensa.  · Tiene fiebre.  · Presenta rigidez en el cuello.  · Tiene pérdida de visión.  · Presenta debilidad muscular o pérdida del control muscular.  · Comienza a perder el equilibrio o tiene problemas para caminar.  · Sufre mareos o se desmaya.  · Tiene síntomas graves que son diferentes a los primeros   síntomas.  ASEGÚRESE DE QUE:   · Comprende estas instrucciones.  · Controlará su afección.  · Recibirá ayuda de inmediato si no mejora o si empeora.  Document Released: 03/15/2005 Document Revised: 01/03/2013  ExitCare® Patient Information ©2014 [employer].

## 2013-05-04 ENCOUNTER — Ambulatory Visit: Payer: BC Managed Care – PPO | Admitting: Internal Medicine

## 2013-05-09 ENCOUNTER — Encounter: Payer: BC Managed Care – PPO | Admitting: Obstetrics and Gynecology

## 2013-05-15 ENCOUNTER — Encounter: Payer: Self-pay | Admitting: Advanced Practice Midwife

## 2013-05-15 ENCOUNTER — Ambulatory Visit (INDEPENDENT_AMBULATORY_CARE_PROVIDER_SITE_OTHER): Payer: Medicaid Other | Admitting: Advanced Practice Midwife

## 2013-05-15 VITALS — BP 107/67 | Temp 97.0°F | Wt 164.7 lb

## 2013-05-15 DIAGNOSIS — R9389 Abnormal findings on diagnostic imaging of other specified body structures: Secondary | ICD-10-CM

## 2013-05-15 DIAGNOSIS — O09529 Supervision of elderly multigravida, unspecified trimester: Secondary | ICD-10-CM

## 2013-05-15 LAB — POCT URINALYSIS DIP (DEVICE)
BILIRUBIN URINE: NEGATIVE
Glucose, UA: NEGATIVE mg/dL
HGB URINE DIPSTICK: NEGATIVE
NITRITE: NEGATIVE
Protein, ur: 30 mg/dL — AB
Urobilinogen, UA: 0.2 mg/dL (ref 0.0–1.0)
pH: 6 (ref 5.0–8.0)

## 2013-05-15 NOTE — Progress Notes (Signed)
Does want TOLAC, but did not bring consent with her, will bring next time. US for anatomy ordered. Quad screen done today. No bleeding. Problem list box almost completed.

## 2013-05-15 NOTE — Progress Notes (Signed)
[id number] 

## 2013-05-15 NOTE — Patient Instructions (Signed)
Segundo trimestre del embarazo  (Second Trimester of Pregnancy) El segundo trimestre del embarazo se extiende desde la semana 13 hasta la semana 28, del 4 al 6 mes. En general, es el momento del embarazo en el que se sentir mejor. Su organismo se ha adaptado a estar embarazada y comienza a sentirse fsicamente mejor. En general las nuseas matutinas han disminuido o han desaparecido completamente. El segundo trimestre es tambin la poca en la que el feto se desarrolla rpidamente. Hacia el final del sexto mes, el beb mide aproximadamente 9 pulgadas (23 cm) y pesa alrededor de 1  libras (700 g). Es probable que sienta mover al beb (dar pataditas) entre las 18 y 20 semanas del embarazo.  CAMBIOS CORPORALES  Su organismo atravesar numerosos cambios durante el embarazo. Los cambios varan de una mujer a otra.   Seguir aumentando de peso. Notar que la parte baja del abdomen se ensancha.  Podrn aparecer las primeras estras en las caderas, abdomen y mamas.  Es posible que sienta cefaleas, que se pueden aliviar con los medicamentos que su mdico le autorice a utilizar.  Tendr necesidad de orinar con ms frecuencia porque el feto est presionando en la vejiga.  Como consecuencia del embarazo, podr sentir acidez estomacal continuamente.  Podr estar constipada ya que ciertas hormonas hacen que los msculos que empujan los desechos a travs de los intestinos trabajen ms lentamente.  Pueden aparecer hemorroides o abultarse las venas (venas varicosas).  El dolor de espalda se debe al aumento de peso y a que las hormonas del embarazo relajan las articulaciones entre los huesos de la pelvis y a la modificacin del peso y a los msculos que soportan el equilibrio.  Sus mamas seguirn desarrollndose y estarn ms sensibles.  Las encas pueden sangrar y estar sensibles al cepillado y al hilo dental.  Pueden aparecer en el rostro zonas oscuras o manchas (cloasma, mscara del embarazo). Esto  desaparece despus del nacimiento del beb.  Es posible que se formeuna lnea oscura desde el ombligo a la zona del pubis (linea nigra). Esto desaparece despus del nacimiento del beb. QU DEBE ESPERAR EN LAS CONSULTAS PRENATALES  Durante una visita prenatal de rutina:   La pesarn para verificar que usted y el feto se encuentran dentro de los lmites normales.  Le tomarn la presin arterial.  Le medirn el abdomen para verificar el desarrollo del beb.  Escucharn los latidos fetales.  Se evaluarn los resultados de los estudios realizados en visitas anteriores. El mdico le preguntar:   Cmo se siente.  Si siente los movimientos del beb.  Si tiene sntomas anormales, como prdida de lquido, sangrado, dolores de cabeza intensos o clicos abdominales.  Si tiene alguna duda. Otros estudios que podrn realizarse durante el segundo trimestre son:   Anlisis de sangre para evaluar:  Niveles bajos de hierro (anemia).  Diabetes gestacional (entre las 24 y las 28 semanas).  Anticuerpos Rh.  Anlisis de orina para detectar infecciones, diabetes o protenas en la orina.  Una ecografa para confirmar si el crecimiento y el desarrollo del beb son los adecuados.  Una amniocentesis para diagnosticar posibles problemas genticos.  Estudios del feto para descartar espina bfida y sndrome de [place]. INSTRUCCIONES PARA EL CUIDADO EN EL HOGAR   Evite fumar, consumir hierbas, beber alcohol y utilizar frmacos que no ne hayan recetado. Estas sustancias qumicas afectan la formacin y el desarrollo del beb.  Siga las indicaciones del profesional con respecto a como tomar los medicamentos. Durante el embarazo, hay   medicamentos que son seguros y otros no lo son.  Realice actividad fsica slo segn las indicaciones del mdico. Sentir clicos uterinos es el mejor signo para detener la actividad fsica.  Contine haciendo comidas regulares y sanas.  Use un sostn que le brinde buen  soporte si sus mamas estn sensibles.  No utilice la baera con agua caliente, baos turcos y saunas.  Colquese el cinturn de seguridad cuando conduzca.  Evite comer carne cruda y el contacto con los utensilios y desperdicios de los gatos. Estos elementos contienen grmenes que pueden causar defectos de nacimiento en el beb.  Tome las vitaminas indicadas para la etapa prenatal.  Pruebe un laxante (si el mdico la autoriza) si tiene constipacin. Consuma ms alimentos ricos en fibra, como vegetales y frutas frescos y cereales enteros. Beba gran cantidad de lquido para mantener la orina de tono claro o amarillo plido.  Tome baos de agua tibia para calmar el dolor o las molestias causadas por las hemorroides. Use una crema para las hemorroides si el mdico la autoriza.  Si tiene venas varicosas, use medias de soporte. Eleve los pies durante 15 minutos, 3 o 4 veces por da. Limite el consumo de sal en su dieta.  Evite levantar objetos pesados, use zapatos de tacones bajos y mantenga una buena postura.  Descanse con las piernas elevadas si tiene calambres o dolor de cintura.  Visite a su dentista si no lo ha hecho durante el embarazo. Use un cepillo de dientes blando para higienizarse los dientes y use suavemente el hilo dental.  Puede continuar su vida sexual siempre que el mdico la autorice.  Concurra a todas las visitas prenatales segn las indicaciones de su mdico. SOLICITE ATENCIN MDICA SI:   Tiene mareos.  Siente clicos leves, presin en la pelvis o dolor persistente en el abdomen.  Tiene nuseas o vmitos o diarrea persistentes.  Observa una secrecin vaginal con mal olor.  Siente dolor al orinar. SOLICITE ATENCIN MDICA DE INMEDIATO SI:   Tiene fiebre.  Pierde lquido por la vagina.  Tiene sangrando o pequeas prdidas vaginales.  Siente dolor intenso o clicos en el abdomen.  Sube o baja de peso rpidamente.  Tiene dificultad para respirar y le duele  pecho.  Sbitamente se le hincha el rostro, las manos, los tobillos, los pies o las piernas de manera extrema.  No ha sentido los movimientos del beb durante una hora.  Siente un dolor de cabeza intenso que no se alivia con medicamentos.  Su visin se modifica. Document Released: 12/23/2004 Document Revised: 11/15/2012 [employer] Patient Information 2014 [employer].  

## 2013-05-18 ENCOUNTER — Telehealth: Payer: Self-pay | Admitting: *Deleted

## 2013-05-18 ENCOUNTER — Encounter: Payer: Self-pay | Admitting: Advanced Practice Midwife

## 2013-05-18 DIAGNOSIS — O358XX0 Maternal care for other (suspected) fetal abnormality and damage, not applicable or unspecified: Secondary | ICD-10-CM | POA: Insufficient documentation

## 2013-05-18 LAB — AFP, QUAD SCREEN
AFP: 14.6 IU/mL
Curr Gest Age: 16.3 wks.days
Down Syndrome Scr Risk Est: 1:85 {titer}
HCG TOTAL: 29020 m[IU]/mL
INH: 201.6 pg/mL
Interpretation-AFP: POSITIVE — AB
MOM FOR HCG: 1.64
MoM for AFP: 0.51
MoM for INH: 1.34
OPEN SPINA BIFIDA: NEGATIVE
Osb Risk: <1:27300 {titer}
TRI 18 SCR RISK EST: NEGATIVE
Trisomy 18 (Edward) Syndrome Interp.: 1:7850 {titer}
UE3 VALUE: 0.6 ng/mL
uE3 Mom: 1.03

## 2013-05-18 NOTE — Telephone Encounter (Signed)
Message copied by Mannie StabileASH, Mykale Gandolfo A on Fri May 18, 2013  9:07 AM ------      Message from: Aviva SignsWILLIAMS, MARIE L      Created: Fri May 18, 2013  8:05 AM      Regarding: Abnormal AFP       AFP screen Positive            She had an 11 wks US so I think her dates are good            Needs MFM consult to discuss results      Is this the way we handle this?            I guess we should double check and make sure the correct EDC based on 11 wk US was used.            Thanks      Hilda LiasMarie            (let me know if there is anything else I should do) ------

## 2013-05-18 NOTE — Telephone Encounter (Signed)
Sheila Foster is recalculating her Quad Screen. Some of the information for the order was not entered correctly.

## 2013-05-22 NOTE — Telephone Encounter (Signed)
Opened in error

## 2013-06-05 ENCOUNTER — Ambulatory Visit (HOSPITAL_COMMUNITY)
Admission: RE | Admit: 2013-06-05 | Discharge: 2013-06-05 | Disposition: A | Discharge status: Home / Self Care | Payer: Medicaid Other | Source: Ambulatory Visit | Attending: Advanced Practice Midwife | Admitting: Advanced Practice Midwife

## 2013-06-05 ENCOUNTER — Other Ambulatory Visit: Payer: Self-pay | Admitting: Advanced Practice Midwife

## 2013-06-05 DIAGNOSIS — O09299 Supervision of pregnancy with other poor reproductive or obstetric history, unspecified trimester: Secondary | ICD-10-CM | POA: Insufficient documentation

## 2013-06-05 DIAGNOSIS — O34219 Maternal care for unspecified type scar from previous cesarean delivery: Secondary | ICD-10-CM | POA: Insufficient documentation

## 2013-06-05 DIAGNOSIS — R9389 Abnormal findings on diagnostic imaging of other specified body structures: Secondary | ICD-10-CM

## 2013-06-05 DIAGNOSIS — O09529 Supervision of elderly multigravida, unspecified trimester: Secondary | ICD-10-CM | POA: Insufficient documentation

## 2013-06-11 ENCOUNTER — Telehealth: Payer: Self-pay | Admitting: General Practice

## 2013-06-11 NOTE — Telephone Encounter (Signed)
Patient called into front office stating she thought she might have been having some contractions yesterday but today she is having lower back pain that is constant and some sharp pains in the front. Told patient if she is having contractions she needs to go to MAU, if not then she can try taking some tylenol and using a heating pad and when she comes in tomorrow for her appt we can check her urine to make sure she doesn't have a UTI. Patient verbalized understanding to all and had no further questions

## 2013-06-12 ENCOUNTER — Other Ambulatory Visit (HOSPITAL_COMMUNITY)
Admission: RE | Admit: 2013-06-12 | Discharge: 2013-06-12 | Disposition: A | Discharge status: Home / Self Care | Payer: Medicaid Other | Source: Ambulatory Visit | Attending: Obstetrics and Gynecology | Admitting: Obstetrics and Gynecology

## 2013-06-12 ENCOUNTER — Encounter: Payer: Self-pay | Admitting: Obstetrics and Gynecology

## 2013-06-12 ENCOUNTER — Ambulatory Visit (INDEPENDENT_AMBULATORY_CARE_PROVIDER_SITE_OTHER): Payer: Medicaid Other | Admitting: Obstetrics and Gynecology

## 2013-06-12 VITALS — BP 111/73 | Temp 98.5°F | Wt 168.7 lb

## 2013-06-12 DIAGNOSIS — A6000 Herpesviral infection of urogenital system, unspecified: Secondary | ICD-10-CM

## 2013-06-12 DIAGNOSIS — Z1151 Encounter for screening for human papillomavirus (HPV): Secondary | ICD-10-CM | POA: Insufficient documentation

## 2013-06-12 DIAGNOSIS — O09529 Supervision of elderly multigravida, unspecified trimester: Secondary | ICD-10-CM

## 2013-06-12 DIAGNOSIS — Z01419 Encounter for gynecological examination (general) (routine) without abnormal findings: Secondary | ICD-10-CM | POA: Insufficient documentation

## 2013-06-12 DIAGNOSIS — Z113 Encounter for screening for infections with a predominantly sexual mode of transmission: Secondary | ICD-10-CM | POA: Insufficient documentation

## 2013-06-12 LAB — OB RESULTS CONSOLE RUBELLA ANTIBODY, IGM: RUBELLA: NON-IMMUNE/NOT IMMUNE

## 2013-06-12 LAB — POCT URINALYSIS DIP (DEVICE)
BILIRUBIN URINE: NEGATIVE
Glucose, UA: NEGATIVE mg/dL
HGB URINE DIPSTICK: NEGATIVE
Nitrite: NEGATIVE
PH: 6 (ref 5.0–8.0)
PROTEIN: NEGATIVE mg/dL
Urobilinogen, UA: 0.2 mg/dL (ref 0.0–1.0)

## 2013-06-12 MED ORDER — VALACYCLOVIR HCL 500 MG PO TABS: 500.0000 mg | ORAL_TABLET | Freq: Two times a day (BID) | ORAL | Status: DC

## 2013-06-12 NOTE — Progress Notes (Signed)
Labs: ABS, rubella, RPR, CMP, Pap, G/C done. Reviewed initial office US at 3972w2d. Had "contractions" for about an hour 2 days ago, no abd pain today. No LOF or VB.  No vaginal itch but has pain on intercourse. 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 Cx friable; large amount mucusy yellow discharge> WP sent. Anatomic US normal; cx 4.2 cm.  Forgot TOLAC consent> bring next.

## 2013-06-12 NOTE — Progress Notes (Signed)
P=  88, Denies feeling baby move, only that abdomen gets hard on one side sometimes. C/o pelvic pain sometimes. C/o 2 strong contractions on Sunday, none since then.

## 2013-06-12 NOTE — Patient Instructions (Signed)
Herpes durante y despus del embarazo.  (Herpes During and After Pregnancy) El herpes genital es una infeccin de transmisin sexual. La causa es un virus y puede ser muy grave durante el Psychiatrist. La mayor preocupacin es que el virus pase al feto y al recin nacido. Es ms probable que el virus pase al beb durante el parto si la madre se infecta por primera vez a finales de su embarazo (infeccin primaria). Es menos probable que pase al recin nacido si la madre tena herpes antes de [place]. Esto se debe a que los anticuerpos contra el virus se desarrollan durante cierto perodo de [place]. Estos anticuerpos ayudan a proteger al beb. Por ltimo, la infeccin puede pasar al feto a travs de la placenta. Esto puede ocurrir si la madre tiene herpes por primera vez en los primeros 3 meses de Psychiatrist (primer trimestre). Es probable que esto provoque un aborto espontneo o defectos congnitos en el beb.  TRATAMIENTO DURANTE EL EMBARAZO  Le recetarn medicamentos que son seguros para la madre y el feto. Estos medicamentos disminuyen los sntomas o previenen una recurrencia de la infeccin. Si la infeccin ocurri antes de [place], le podrn Scientist, physiological en las ltimas 4 semanas del [place]. De este modo se evita una infeccin recurrente en el momento del [place].  TIPO DE PARTO RECOMENDADO  Si hay una infeccin activa y recurrente en el momento del [place], el beb debe nacer por cesrea. El motivo es que el virus puede pasar al beb a travs del canal de parto infectado. En este caso el beb puede tener problemas graves. Si la infeccin se produce por primera vez a finales del embarazo, el mdico tambin puede recomendar un parto por cesrea. En el caso de una infeccin nueva, el organismo no tiene tiempo de crear suficientes anticuerpos contra el virus para proteger al beb de contraer la infeccin.  Incluso si el canal del parto no tiene lceras visibles (lesiones) por el virus del  herpes, existe la posibilidad de que el virus se contagie al beb. El parto por cesrea debe hacerse si hay cualquier signo o sospecha de infeccin en la zona genital.  El parto por cesrea no se recomienda en las mujeres con antecedentes de infeccin por herpes, pero sin evidencia de lesiones genitales activas en el momento del parto. Las lesiones que tienen costras se consideran completamente curadas y no [place].  LACTANCIA MATERNA  Las mujeres infectadas con herpes genital pueden amamantar a su beb. El virus no estar presente en la [place]. Si hay lesiones en el pecho, no debe amamantar del lado afectado.  CMO EVITAR LA TRANSMISIN DEL HERPES AL BEB DESPUS DEL PARTO   Lvese las manos con agua y [place] con frecuencia y antes de tocar al beb.  Si tiene un brote, mantenga el rea limpia y [place].  Trate de evitar las situaciones fsicas y estresantes que pueden provocar un brote. SOLICITE ATENCIN MDICA DE INMEDIATO SI:   Tiene un brote durante el embarazo y no puede orinar.  Aparece un brote en cualquier momento del embarazo, especialmente durante los ltimos 3 meses.  Sospecha que tiene Runner, broadcasting/film/video o sufre efectos secundarios por los medicamentos que est tomando. Document Released: 12/23/2004 Document Revised: 06/07/2011 [facility] Patient Information 2014 [place], [place]. Segundo trimestre del Psychiatrist  (Second Trimester of Pregnancy) El segundo trimestre del Psychiatrist se extiende desde la semana 13 hasta la semana 28, del 4 al 6 mes. En general, es el momento del embarazo en  el que se Ship broker. Su organismo se ha adaptado a Charity fundraiser y comienza a Diplomatic Services operational officer. En general las nuseas matutinas han disminuido o han desaparecido completamente. El segundo trimestre es tambin la poca en la que el feto se desarrolla rpidamente. Hacia el final del sexto mes, el beb mide aproximadamente 9 pulgadas (23 cm) y pesa alrededor de 1  libras (700  g). Es probable que Architectural technologist al beb (dar pataditas) entre las 18 y 20 semanas del Psychiatrist.  CAMBIOS CORPORALES  Su organismo atravesar numerosos cambios durante el [place]. Los cambios varan de una mujer a Educational psychologist.   Seguir [employer]. Notar que la parte baja del abdomen se ensancha.  Podrn aparecer las primeras estras en las caderas, abdomen y [place].  Es posible que sienta cefaleas, que se pueden Paramedic con los medicamentos que su mdico le autorice a Chemical engineer.  Tendr necesidad de orinar con ms frecuencia porque el feto est presionando en la vejiga.  Como consecuencia del Psychiatrist, podr sentir Actor.  Podr estar constipada ya que ciertas hormonas hacen que los msculos que [employer] desechos a travs de los intestinos trabajen ms lentamente.  Pueden aparecer hemorroides o abultarse las venas (venas varicosas).  El dolor de espalda se debe al aumento de peso y a que las hormonas del Management consultant las articulaciones entre los huesos de la pelvis y a la modificacin del peso y a los msculos que soportan el equilibrio.  Sus mamas seguirn desarrollndose y estarn ms sensibles.  Las Veterinary surgeon y estar sensibles al cepillado y al hilo dental.  Pueden aparecer en el rostro zonas oscuras o manchas ([place], mscara del [place]). Esto desaparece despus del nacimiento del beb.  Es posible que se formeuna lnea oscura desde el ombligo a la zona del pubis (linea nigra). Esto desaparece despus del nacimiento del beb. QU DEBE ESPERAR EN LAS CONSULTAS PRENATALES  Durante una visita prenatal de rutina:   La pesarn para verificar que usted y el feto se encuentran dentro de los lmites normales.  Le tomarn la presin arterial.  Le medirn el abdomen para verificar el desarrollo del beb.  Escucharn los latidos fetales.  Se evaluarn los resultados de los estudios realizados en visitas anteriores. El mdico le  preguntar:   Cmo se siente.  Si siente los movimientos del beb.  Si tiene sntomas anormales, como prdida de lquido, [place], dolores de cabeza intensos o clicos abdominales.  Si tiene [place] duda. Otros estudios que podrn realizarse durante el segundo trimestre son:   Anlisis de sangre para evaluar:  Niveles bajos de hierro (anemia).  Diabetes gestacional (entre las 24 y las 28 [address]).  Anticuerpos Rh.  Anlisis de orina para detectar infecciones, diabetes o protenas en la orina.  Una ecografa para confirmar si el crecimiento y el desarrollo del beb son los [place].  Una amniocentesis para diagnosticar posibles problemas genticos.  Estudios del feto para descartar espina bfida y sndrome de Down. INSTRUCCIONES PARA EL CUIDADO EN EL HOGAR   Evite fumar, consumir hierbas, beber alcohol y Chemical engineer frmacos que no ne hayan recetado. Estas sustancias qumicas afectan la formacin y el desarrollo del beb.  Siga las indicaciones del profesional con respecto a Adult nurse. Durante el embarazo, hay medicamentos que son seguros y otros no lo son.  Realice actividad fsica slo segn las indicaciones del mdico. Sentir clicos uterinos es el mejor signo para Restaurant manager, fast food actividad fsica.  Contine haciendo comidas regulares  y sanas.  Use un sostn que le brinde buen soporte si sus mamas estn sensibles.  No utilice la baera con agua caliente, baos turcos y saunas.  Colquese el cinturn de seguridad cuando conduzca.  Evite comer carne cruda y el contacto con los utensilios y desperdicios de los gatos. Estos elementos contienen grmenes que pueden causar defectos de nacimiento en el beb.  Tome las vitaminas indicadas para la etapa prenatal.  Pruebe un laxante (si el mdico la autoriza) si tiene constipacin. Consuma ms alimentos ricos en fibra, como vegetales y frutas frescos y cereales enteros. Beba gran cantidad de lquido para mantener la  orina de tono claro o amarillo plido.  Tome baos de agua tibia para Primary school teachercalmar el dolor o las molestias causadas por las hemorroides. Use una crema para las hemorroides si el mdico la autoriza.  Si tiene venas varicosas, use medias de soporte. Eleve los pies durante 15 minutos, 3 o 4 veces por da. Limite el consumo de sal en su dieta.  Evite levantar objetos pesados, use zapatos de tacones bajos y [place]mantenga una buena postura.  Descanse con las piernas elevadas si tiene calambres o dolor de cintura.  Visite a su dentista si no lo ha Occupational hygienisthecho durante el embarazo. Use un cepillo de dientes blando para higienizarse los dientes y use suavemente el hilo dental.  Puede continuar su vida sexual siempre que el mdico la autorice.  Concurra a todas las visitas prenatales segn las indicaciones de su mdico. SOLICITE ATENCIN MDICA SI:   Santa Generaiene mareos.  Siente clicos leves, presin en la pelvis o dolor persistente en el abdomen.  Tiene nuseas o vmitos o diarrea persistentes.  Brett Fairybserva una secrecin vaginal con mal olor.  Siente dolor al ConocoPhillipsorinar. SOLICITE ATENCIN MDICA DE INMEDIATO SI:   Tiene fiebre.  Pierde lquido por la vagina.  Tiene sangrando o pequeas prdidas vaginales.  Siente dolor intenso o clicos en el abdomen.  Sube o baja de peso rpidamente.  Tiene dificultad para respirar y Geographical information systems officerle duele pecho.  Sbitamente se le hincha el rostro, las manos, los tobillos, los pies o las piernas de [place] Townmanera extrema.  No ha sentido los movimientos del beb durante Georgianne Fickuna hora.  Siente un dolor de cabeza intenso que no se alivia con medicamentos.  Su visin se modifica. Document Released: 12/23/2004 Document Revised: 11/15/2012 [facility] Patient Information 2014 [place], [place].

## 2013-06-13 LAB — COMPREHENSIVE METABOLIC PANEL
ALT: 18 U/L (ref 0–35)
AST: 16 U/L (ref 0–37)
Albumin: 3.6 g/dL (ref 3.5–5.2)
Alkaline Phosphatase: 66 U/L (ref 39–117)
BILIRUBIN TOTAL: 0.2 mg/dL (ref 0.2–1.2)
BUN: 7 mg/dL (ref 6–23)
CALCIUM: 9.2 mg/dL (ref 8.4–10.5)
CO2: 23 meq/L (ref 19–32)
Chloride: 104 mEq/L (ref 96–112)
Creat: 0.48 mg/dL — ABNORMAL LOW (ref 0.50–1.10)
GLUCOSE: 71 mg/dL (ref 70–99)
Potassium: 4.6 mEq/L (ref 3.5–5.3)
Sodium: 136 mEq/L (ref 135–145)
Total Protein: 6.3 g/dL (ref 6.0–8.3)

## 2013-06-13 LAB — WET PREP, GENITAL
CLUE CELLS WET PREP: NONE SEEN
Trich, Wet Prep: NONE SEEN
YEAST WET PREP: NONE SEEN

## 2013-06-13 LAB — RPR

## 2013-06-14 ENCOUNTER — Encounter: Payer: Self-pay | Admitting: Advanced Practice Midwife

## 2013-06-14 LAB — HERPES SIMPLEX VIRUS CULTURE: ORGANISM ID, BACTERIA: NOT DETECTED

## 2013-06-15 LAB — RUBELLA ANTIBODY, IGM: Rubella IgM: 0.23 (ref <0.90)

## 2013-07-10 ENCOUNTER — Encounter: Payer: Medicaid Other | Admitting: Advanced Practice Midwife

## 2013-07-17 ENCOUNTER — Other Ambulatory Visit: Payer: Self-pay | Admitting: Family Medicine

## 2013-07-17 ENCOUNTER — Ambulatory Visit (INDEPENDENT_AMBULATORY_CARE_PROVIDER_SITE_OTHER): Payer: Medicaid Other | Admitting: Family Medicine

## 2013-07-17 ENCOUNTER — Encounter: Payer: Self-pay | Admitting: Family Medicine

## 2013-07-17 VITALS — BP 99/67 | HR 96 | Temp 97.5°F | Wt 175.7 lb

## 2013-07-17 DIAGNOSIS — R772 Abnormality of alphafetoprotein: Secondary | ICD-10-CM

## 2013-07-17 DIAGNOSIS — O34219 Maternal care for unspecified type scar from previous cesarean delivery: Secondary | ICD-10-CM

## 2013-07-17 DIAGNOSIS — O09529 Supervision of elderly multigravida, unspecified trimester: Secondary | ICD-10-CM

## 2013-07-17 LAB — POCT URINALYSIS DIP (DEVICE)
Bilirubin Urine: NEGATIVE
Glucose, UA: NEGATIVE mg/dL
Hgb urine dipstick: NEGATIVE
Ketones, ur: NEGATIVE mg/dL
Nitrite: NEGATIVE
PH: 6 (ref 5.0–8.0)
PROTEIN: NEGATIVE mg/dL
Specific Gravity, Urine: 1.02 (ref 1.005–1.030)
UROBILINOGEN UA: 0.2 mg/dL (ref 0.0–1.0)

## 2013-07-17 NOTE — Progress Notes (Signed)
Edema-feet  Pain-cramping

## 2013-07-17 NOTE — Progress Notes (Signed)
+  FM, no lof, no vb, no ctx  Reviewed US. WNL Discussed TOLAC - vertical skin incision - inside was koernig  Sheila Foster is a 38 y.o. G3P2002 at 5934w4d here for ROB visit.  Discussed with Patient:  -Plans to breast feed.  All questions answered. -Continue prenatal vitamins. - Reviewed genetics scree -- eelvated AFP - refer to genetic counseling -Reviewed fetal kick counts (Pt to perform daily at a time when the baby is active, lie laterally with both hands on belly in quiet room and count all movements (hiccups, shoulder rolls, obvious kicks, etc); pt is to report to clinic or MAU for less than 10 movements felt in a one hour time period-pt told as soon as she counts 10 movements the count is complete.)  - Routine precautions discussed (depression, infection s/s).   Patient provided with all pertinent phone numbers for emergencies. - RTC for any VB, regular, painful cramps/ctxs occurring at a rate of >2/10 min, fever (100.5 or higher), n/v/d, any pain that is unresolving or worsening, LOF, decreased fetal movement, CP, SOB, edema  Problems: Patient Active Problem List   Diagnosis Date Noted  . Other known or suspected fetal abnormality, not elsewhere classified, affecting management of mother, antepartum condition or complication 05/18/2013  . Migraine without aura 04/24/2013  . Anxiety 04/24/2013  . AMA (advanced maternal age) multigravida 35+ 04/18/2013  . Previous cesarean section complicating pregnancy 04/18/2013  . Pregnant 04/03/2013  . Shingles 01/01/2013  . Anemia 01/01/2013  . Cough 08/03/2012  . Elevated LFTs 08/03/2012  . Prediabetes 08/03/2012  . Family history of type II diabetes mellitus 05/15/2012  . Mastodynia, female 05/15/2012  . OAB (overactive bladder) 05/15/2012  . ESOPHAGEAL REFLUX 11/14/2008    To Do:   [ ]  Vaccines: [id number]  Tdap:  [ ]  BCM: condoms  Edu: [x ] PTL precautions; [ ]  BF class; [ ]  childbirth class; [ ]   BF counseling;

## 2013-07-17 NOTE — Patient Instructions (Signed)
Segundo trimestre del embarazo  (Second Trimester of Pregnancy) El segundo trimestre del embarazo se extiende desde la semana 13 hasta la semana 28, del 4 al 6 mes. En general, es el momento del embarazo en el que se sentir mejor. Su organismo se ha adaptado a estar embarazada y comienza a sentirse fsicamente mejor. En general las nuseas matutinas han disminuido o han desaparecido completamente. El segundo trimestre es tambin la poca en la que el feto se desarrolla rpidamente. Hacia el final del sexto mes, el beb mide aproximadamente 9 pulgadas (23 cm) y pesa alrededor de 1  libras (700 g). Es probable que sienta mover al beb (dar pataditas) entre las 18 y 20 semanas del embarazo.  CAMBIOS CORPORALES  Su organismo atravesar numerosos cambios durante el embarazo. Los cambios varan de una mujer a otra.   Seguir aumentando de peso. Notar que la parte baja del abdomen se ensancha.  Podrn aparecer las primeras estras en las caderas, abdomen y mamas.  Es posible que sienta cefaleas, que se pueden aliviar con los medicamentos que su mdico le autorice a utilizar.  Tendr necesidad de orinar con ms frecuencia porque el feto est presionando en la vejiga.  Como consecuencia del embarazo, podr sentir acidez estomacal continuamente.  Podr estar constipada ya que ciertas hormonas hacen que los msculos que empujan los desechos a travs de los intestinos trabajen ms lentamente.  Pueden aparecer hemorroides o abultarse las venas (venas varicosas).  El dolor de espalda se debe al aumento de peso y a que las hormonas del embarazo relajan las articulaciones entre los huesos de la pelvis y a la modificacin del peso y a los msculos que soportan el equilibrio.  Sus mamas seguirn desarrollndose y estarn ms sensibles.  Las encas pueden sangrar y estar sensibles al cepillado y al hilo dental.  Pueden aparecer en el rostro zonas oscuras o manchas (cloasma, mscara del embarazo). Esto  desaparece despus del nacimiento del beb.  Es posible que se formeuna lnea oscura desde el ombligo a la zona del pubis (linea nigra). Esto desaparece despus del nacimiento del beb. QU DEBE ESPERAR EN LAS CONSULTAS PRENATALES  Durante una visita prenatal de rutina:   La pesarn para verificar que usted y el feto se encuentran dentro de los lmites normales.  Le tomarn la presin arterial.  Le medirn el abdomen para verificar el desarrollo del beb.  Escucharn los latidos fetales.  Se evaluarn los resultados de los estudios realizados en visitas anteriores. El mdico le preguntar:   Cmo se siente.  Si siente los movimientos del beb.  Si tiene sntomas anormales, como prdida de lquido, sangrado, dolores de cabeza intensos o clicos abdominales.  Si tiene alguna duda. Otros estudios que podrn realizarse durante el segundo trimestre son:   Anlisis de sangre para evaluar:  Niveles bajos de hierro (anemia).  Diabetes gestacional (entre las 24 y las 28 semanas).  Anticuerpos Rh.  Anlisis de orina para detectar infecciones, diabetes o protenas en la orina.  Una ecografa para confirmar si el crecimiento y el desarrollo del beb son los adecuados.  Una amniocentesis para diagnosticar posibles problemas genticos.  Estudios del feto para descartar espina bfida y sndrome de [place]. INSTRUCCIONES PARA EL CUIDADO EN EL HOGAR   Evite fumar, consumir hierbas, beber alcohol y utilizar frmacos que no ne hayan recetado. Estas sustancias qumicas afectan la formacin y el desarrollo del beb.  Siga las indicaciones del profesional con respecto a como tomar los medicamentos. Durante el embarazo, hay   medicamentos que son seguros y otros no lo son.  Realice actividad fsica slo segn las indicaciones del mdico. Sentir clicos uterinos es el mejor signo para detener la actividad fsica.  Contine haciendo comidas regulares y sanas.  Use un sostn que le brinde buen  soporte si sus mamas estn sensibles.  No utilice la baera con agua caliente, baos turcos y saunas.  Colquese el cinturn de seguridad cuando conduzca.  Evite comer carne cruda y el contacto con los utensilios y desperdicios de los gatos. Estos elementos contienen grmenes que pueden causar defectos de nacimiento en el beb.  Tome las vitaminas indicadas para la etapa prenatal.  Pruebe un laxante (si el mdico la autoriza) si tiene constipacin. Consuma ms alimentos ricos en fibra, como vegetales y frutas frescos y cereales enteros. Beba gran cantidad de lquido para mantener la orina de tono claro o amarillo plido.  Tome baos de agua tibia para calmar el dolor o las molestias causadas por las hemorroides. Use una crema para las hemorroides si el mdico la autoriza.  Si tiene venas varicosas, use medias de soporte. Eleve los pies durante 15 minutos, 3 o 4 veces por da. Limite el consumo de sal en su dieta.  Evite levantar objetos pesados, use zapatos de tacones bajos y mantenga una buena postura.  Descanse con las piernas elevadas si tiene calambres o dolor de cintura.  Visite a su dentista si no lo ha hecho durante el embarazo. Use un cepillo de dientes blando para higienizarse los dientes y use suavemente el hilo dental.  Puede continuar su vida sexual siempre que el mdico la autorice.  Concurra a todas las visitas prenatales segn las indicaciones de su mdico. SOLICITE ATENCIN MDICA SI:   Tiene mareos.  Siente clicos leves, presin en la pelvis o dolor persistente en el abdomen.  Tiene nuseas o vmitos o diarrea persistentes.  Observa una secrecin vaginal con mal olor.  Siente dolor al orinar. SOLICITE ATENCIN MDICA DE INMEDIATO SI:   Tiene fiebre.  Pierde lquido por la vagina.  Tiene sangrando o pequeas prdidas vaginales.  Siente dolor intenso o clicos en el abdomen.  Sube o baja de peso rpidamente.  Tiene dificultad para respirar y le duele  pecho.  Sbitamente se le hincha el rostro, las manos, los tobillos, los pies o las piernas de manera extrema.  No ha sentido los movimientos del beb durante una hora.  Siente un dolor de cabeza intenso que no se alivia con medicamentos.  Su visin se modifica. Document Released: 12/23/2004 Document Revised: 11/15/2012 [employer] Patient Information 2014 [employer].  

## 2013-07-18 ENCOUNTER — Encounter: Payer: Self-pay | Admitting: *Deleted

## 2013-07-24 ENCOUNTER — Ambulatory Visit (HOSPITAL_COMMUNITY)
Admission: RE | Admit: 2013-07-24 | Discharge: 2013-07-24 | Disposition: A | Discharge status: Home / Self Care | Payer: Medicaid Other | Source: Ambulatory Visit | Attending: Family Medicine | Admitting: Family Medicine

## 2013-07-24 ENCOUNTER — Other Ambulatory Visit: Payer: Self-pay | Admitting: Family Medicine

## 2013-07-24 DIAGNOSIS — O09529 Supervision of elderly multigravida, unspecified trimester: Secondary | ICD-10-CM

## 2013-07-24 DIAGNOSIS — O3510X0 Maternal care for (suspected) chromosomal abnormality in fetus, unspecified, not applicable or unspecified: Secondary | ICD-10-CM | POA: Insufficient documentation

## 2013-07-24 DIAGNOSIS — O351XX0 Maternal care for (suspected) chromosomal abnormality in fetus, not applicable or unspecified: Secondary | ICD-10-CM | POA: Insufficient documentation

## 2013-07-24 DIAGNOSIS — R772 Abnormality of alphafetoprotein: Secondary | ICD-10-CM

## 2013-07-24 DIAGNOSIS — IMO0002 Reserved for concepts with insufficient information to code with codable children: Secondary | ICD-10-CM | POA: Insufficient documentation

## 2013-07-24 NOTE — Progress Notes (Signed)
Genetic Counseling  High-Risk Gestation Note  Appointment Date:  07/24/2013 Referred By: Sheila Foster, Sheila R, MD Date of Birth:  01/01/1976    Pregnancy History: [id number] Estimated Date of Delivery: 11/02/13 Estimated Gestational Age: 2930w4d Attending: Particia NearingMartha Decker, MD    [name] was seen for genetic counseling regarding a maternal age of 38 y.o. and an increased risk for Down syndrome based on Quad screen. [facility] Spanish/English medical interpreter provided interpretation for today's visit.   She was counseled regarding maternal age and the association with risk for chromosome conditions due to nondisjunction with aging of the ova.   We reviewed chromosomes, nondisjunction, and the associated 1 in 111 risk for fetal aneuploidy related to a maternal age of 38 y.o. at 5130w4d gestation.  They were counseled that the risk for aneuploidy decreases as gestational age increases, accounting for those pregnancies which spontaneously abort.  We specifically discussed Down syndrome (trisomy 5021), trisomies 7013 and 5118, and sex chromosome aneuploidies (47,XXX and 47,XXY) including the common features and prognoses of each.   We also reviewed Sheila Foster's maternal serum Quad screen result and the associated increase in risk for fetal Down syndrome (1 in 233 to 1 in 85).  She was counseled regarding other explanations for a screen positive result including normal variation and differences in maternal metabolism.  In addition, we reviewed the screen negative risks for trisomy 18 and ONTDs.  She understands that Quad screening provides a pregnancy specific risk for Down syndrome, but is not considered to be diagnostic.    We reviewed available screening options including noninvasive prenatal screening (NIPS)/cell free DNA (cfDNA) testing and detailed ultrasound.  She was counseled that screening tests are used to modify a patient's a priori risk for aneuploidy, typically based on age. This estimate provides a  pregnancy specific risk assessment. We reviewed the benefits and limitations of each option. Specifically, we discussed the conditions for which each test screens, the detection rates, and false positive rates of each. She was also counseled regarding diagnostic testing via amniocentesis. We reviewed the approximate 1 in 300-500 risk for complications for amniocentesis, including spontaneous preterm labor. After consideration of all the options, she declined NIPS and amniocentesis.   A complete ultrasound was performed today. The ultrasound report will be sent under separate cover. There were no visualized fetal anomalies or markers suggestive of aneuploidy. Diagnostic testing was declined today.  She understands that screening tests cannot rule out all birth defects or genetic syndromes. The patient was advised of this limitation and states she still does not want additional testing at this time.    Both family histories were reviewed and found to be contributory for Down syndrome for the father of the pregnancy's niece (his brother's daughter). She reportedly died at a week of age due to a problem with her heart. The patient reported that she was described to have some facial features consistent with Down syndrome. This relative's parents are reportedly distantly related. We discussed that 95% of cases of Down syndrome are not inherited and are the result of non-disjunction.  Three to 4% of cases of Down syndrome are the result of a translocation involving chromosome #21.  We discussed the option of chromosome analysis to determine if an individual is a carrier of a balanced translocation involving chromosome #21.  If an individual carries a balanced translocation involving chromosome #21, then the chance to have a baby with Down syndrome would be greater than the maternal age-related risk.  In the case  that this relative's Down syndrome was sporadic, recurrence risk would not likely be increased for  relatives. In the case of a different underlying cause for her medical features, recurrence risk estimate may change. Without further information regarding the provided family history, an accurate genetic risk cannot be calculated. Further genetic counseling is warranted if more information is obtained.  [name] denied exposure to environmental toxins or chemical agents. She denied the use of alcohol, tobacco or street drugs. She denied significant viral illnesses during the course of her pregnancy. Her medical and surgical histories were noncontributory.     I counseled this couple regarding the above risks and available options.  The approximate face-to-face time with the genetic counselor was 40 minutes.    Sheila AppKaren Foster Sheila SallesEch Katniss Weedman, MS,  Certified Genetic Counselor 07/24/2013

## 2013-08-14 ENCOUNTER — Ambulatory Visit (INDEPENDENT_AMBULATORY_CARE_PROVIDER_SITE_OTHER): Payer: Medicaid Other | Admitting: Advanced Practice Midwife

## 2013-08-14 VITALS — BP 116/63 | HR 96 | Temp 97.7°F | Wt 181.5 lb

## 2013-08-14 DIAGNOSIS — Z3490 Encounter for supervision of normal pregnancy, unspecified, unspecified trimester: Secondary | ICD-10-CM

## 2013-08-14 DIAGNOSIS — O9989 Other specified diseases and conditions complicating pregnancy, childbirth and the puerperium: Secondary | ICD-10-CM

## 2013-08-14 DIAGNOSIS — O09899 Supervision of other high risk pregnancies, unspecified trimester: Secondary | ICD-10-CM

## 2013-08-14 DIAGNOSIS — Z2839 Other underimmunization status: Secondary | ICD-10-CM | POA: Insufficient documentation

## 2013-08-14 DIAGNOSIS — O09529 Supervision of elderly multigravida, unspecified trimester: Secondary | ICD-10-CM

## 2013-08-14 DIAGNOSIS — Z283 Underimmunization status: Secondary | ICD-10-CM

## 2013-08-14 LAB — POCT URINALYSIS DIP (DEVICE)
BILIRUBIN URINE: NEGATIVE
Glucose, UA: NEGATIVE mg/dL
HGB URINE DIPSTICK: NEGATIVE
Nitrite: NEGATIVE
Protein, ur: NEGATIVE mg/dL
Specific Gravity, Urine: 1.025 (ref 1.005–1.030)
Urobilinogen, UA: 0.2 mg/dL (ref 0.0–1.0)
pH: 6.5 (ref 5.0–8.0)

## 2013-08-14 LAB — CBC
HEMATOCRIT: 30.7 % — AB (ref 36.0–46.0)
Hemoglobin: 10.2 g/dL — ABNORMAL LOW (ref 12.0–15.0)
MCH: 27.6 pg (ref 26.0–34.0)
MCHC: 33.2 g/dL (ref 30.0–36.0)
MCV: 83.2 fL (ref 78.0–100.0)
PLATELETS: 224 10*3/uL (ref 150–400)
RBC: 3.69 MIL/uL — AB (ref 3.87–5.11)
RDW: 15.3 % (ref 11.5–15.5)
WBC: 8.9 10*3/uL (ref 4.0–10.5)

## 2013-08-14 NOTE — Progress Notes (Signed)
Glucola today. Informed pt of rubella non-immune status. Will need booster postpartum. Has a little dysuria. Will send urine to culture.

## 2013-08-14 NOTE — Progress Notes (Signed)
C/o occasional mild pelvic pain/pressure and braxton hicks. C/o of burning with urination.  28 week labs and 1hr gtt today.

## 2013-08-14 NOTE — Addendum Note (Signed)
Addended by: Aviva SignsWILLIAMS, MARIE L on: 08/14/2013 03:10 PM   Modules accepted: Orders

## 2013-08-14 NOTE — Patient Instructions (Signed)
Tercer trimestre del embarazo  (Third Trimester of Pregnancy) El tercer trimestre del embarazo abarca desde la semana 29 hasta la semana 42, desde el 7 mes hasta el 9. En este trimestre el feto se desarrolla muy rpidamente. Hacia el final del noveno mes, el beb que an no ha nacido mide alrededor de 20 pulgadas (45 cm) de largo y pesa entre 6 y 10 libras (2,700 y 4,500 kg).  CAMBIOS CORPORALES  Su organismo atravesar numerosos cambios durante el embarazo. Los cambios varan de una mujer a otra.   Seguir aumentando de peso. Es esperable que aumente entre 25 y 35 libras (11 16 kg) hacia el final del embarazo.  Podrn aparecer las primeras estras en las caderas, abdomen y mamas.  Tendr necesidad de orinar con ms frecuencia porque el feto baja hacia la pelvis y presiona en la vejiga.  Como consecuencia del embarazo, podr sentir acidez estomacal continuamente.  Podr estar constipada ya que ciertas hormonas hacen que los msculos que hacen progresar los desechos a travs de los intestinos trabajen ms lentamente.  Pueden aparecer hemorroides o abultarse e hincharse las venas (venas varicosas).  Podr sentir dolor plvico debido al aumento de peso ya que las hormonas del embarazo relajan las articulaciones entre los huesos de la pelvis. El dolor de espalda puede ser consecuencia de la exigencia de los msculos que soportan la postura.  Sus mamas seguirn desarrollndose y estarn ms sensibles. A veces sale una secrecin amarilla de las mamas, que se llama calostro.  El ombligo puede salir hacia afuera.  Podr sentir que le falta el aire debido a que se expande el tero.  Podr notar que el feto "baja" o que se siente ms bajo en el abdomen.  Podr tener una prdida de secrecin mucosa con sangre. Esto suele ocurrir entre unos pocos das y una semana antes del parto.  El cuello se vuelve delgado y blando (se borra) cerca de la fecha de parto. QU DEBE ESPERAR EN LAS CONSULTAS  PRENATALES  Le harn exmenes prenatales cada 2 semanas hasta la semana 36. A partir de ese momento le harn exmenes semanales. Durante una visita prenatal de rutina:   La pesarn para verificar que usted y el feto se encuentran dentro de los lmites normales.  Le tomarn la presin arterial.  Le medirn el abdomen para verificar el desarrollo del beb.  Escucharn los latidos fetales.  Se evaluarn los resultados de los estudios solicitados en visitas anteriores.  Le controlarn el cuello del tero cuando est prxima la fecha de parto para ver si se ha borrado. Alrededor de la semana 36 el mdico controlar el cuello del tero. Al mismo tiempo realizar un anlisis de las secreciones del tejido vaginal. Este examen es para determinar si hay un tipo de bacteria, estreptococo Grupo B. El mdico le explicar esto con ms detalle.  El mdico podr preguntarle:   Como le gustara que fuera el parto.  Cmo se siente.  Si siente los movimientos del beb.  Si tiene sntomas anormales, como prdida de lquido, sangrado, dolores de cabeza intenso o clicos abdominales.  Si tiene alguna duda. Otros estudios que podrn realizarse durante el tercer trimestre son:   Anlisis de sangre para controlar sus niveles de hierro (anemia).  Controles fetales para determinar su salud, el nivel de actividad y su desarrollo. Si tiene alguna enfermedad o si tuvo problemas durante el embarazo, le harn estudios. FALSO TRABAJO DE PARTO  Es posible que sienta contracciones pequeas e irregulares que finalmente   desaparecen. Se llaman contracciones de Braxton Hicks o falso trabajo de parto. Las contracciones pueden durar horas, das o an semanas antes de que el verdadero trabajo de parto se inicie. Si las contracciones tienen intervalos regulares, se intensifican o se hacen dolorosas, lo mejor es que la revise su mdico.  SIGNOS DE TRABAJO DE PARTO   Espasmos del tipo menstrual.  Contracciones cada 5  minutos o menos.  Contracciones que comienzan en la parte superior del tero y se expanden hacia abajo, a la zona inferior del abdomen y la espalda.  Sensacin de presin que aumenta en la pelvis o dolor en la espalda.  Aparece una secrecin acuosa o sanguinolenta por la vagina. Si tiene alguno de estos signos antes de la semana 37 del embarazo, llame a su mdico inmediatamente. Debe concurrir al hospital para ser controlada inmediatamente.  INSTRUCCIONES PARA EL CUIDADO EN EL HOGAR   Evite fumar, consumir hierbas, beber alcohol y utilizar frmacos que no le hayan recetado. Estas sustancias qumicas afectan la formacin y el desarrollo del beb.  Siga las indicaciones del profesional con respecto a como tomar los medicamentos. Durante el embarazo, hay medicamentos que son seguros y otros no lo son.  Realice actividad fsica slo segn las indicaciones del mdico. Sentir clicos uterinos es el mejor signo para detener la actividad fsica.  Contine haciendo comidas regulares y sanas.  Use un sostn que le brinde buen soporte si sus mamas estn sensibles.  No utilice la baera con agua caliente, baos turcos o saunas.  Colquese el cinturn de seguridad cuando conduzca.  Evite comer carne cruda queso sin cocinar y el contacto con los utensilios y desperdicios de los gatos. Estos elementos contienen grmenes que pueden causar defectos de nacimiento en el beb.  Tome las vitaminas indicadas para la etapa prenatal.  Pruebe un laxante (si el mdico la autoriza) si tiene constipacin. Consuma ms alimentos ricos en fibra, como vegetales y frutas frescos y cereales enteros. Beba gran cantidad de lquido para mantener la orina de tono claro o amarillo plido.  Tome baos de agua tibia para calmar el dolor o las molestias causadas por las hemorroides. Use una crema para las hemorroides si el mdico la autoriza.  Si tiene venas varicosas, use medias de soporte. Eleve los pies durante 15 minutos,  3 4 veces por da. Limite el consumo de sal en su dieta.  Evite levantar objetos pesados, use zapatos de tacones bajos y mantenga una buena postura.  Descanse con las piernas elevadas si tiene calambres o dolor de cintura.  Visite a su dentista si no lo ha hecho durante el embarazo. Use un cepillo de dientes blando para higienizarse los dientes y use suavemente el hilo dental.  Puede continuar su vida sexual excepto que el mdico le indique otra cosa.  No haga viajes largos excepto que sea absolutamente necesario y slo con la aprobacin de su mdico.  Tome clases prenatales para entender, practicar y hacer preguntas sobre el trabajo de parto y el alumbramiento.  Haga un ensayo sobre la partida al hospital.  Prepare el bolso que llevar al hospital.  Prepare la habitacin del beb.  Contine concurriendo a todas las visitas prenatales segn las indicaciones de su mdico. SOLICITE ATENCIN MDICA SI:   No est segura si est en trabajo de parto o ha roto la bolsa de aguas.  Tiene mareos.  Siente clicos leves, presin en la pelvis o dolor persistente en el abdomen.  Tiene nuseas o vmitos persistentes.  Observa una   secrecin vaginal con mal olor.  Siente dolor al orinar. SOLICITE ATENCIN MDICA DE INMEDIATO SI:   Tiene fiebre.  Pierde lquido o sangre por la vagina.  Tiene sangrado o pequeas prdidas vaginales.  Siente dolor intenso o clicos en el abdomen.  Sube o baja de peso rpidamente.  Le falta el aire y le duele el pecho al respirar.  Sbitamente se le hincha el rostro, las manos, los tobillos, los pies o las piernas de manera extrema.  No ha sentido los movimientos del beb durante una hora.  Siente un dolor de cabeza intenso que no se alivia con medicamentos.  Su visin se modifica. Document Released: 12/23/2004 Document Revised: 11/15/2012 [employer] Patient Information 2014 [employer].  

## 2013-08-15 LAB — HIV ANTIBODY (ROUTINE TESTING W REFLEX): HIV 1&2 Ab, 4th Generation: NONREACTIVE

## 2013-08-15 LAB — RPR

## 2013-08-15 LAB — GLUCOSE TOLERANCE, 1 HOUR (50G) W/O FASTING: GLUCOSE 1 HOUR GTT: 151 mg/dL — AB (ref 70–140)

## 2013-08-16 LAB — CULTURE, OB URINE: Colony Count: 40000

## 2013-08-20 ENCOUNTER — Encounter: Payer: Self-pay | Admitting: Advanced Practice Midwife

## 2013-08-21 ENCOUNTER — Telehealth: Payer: Self-pay

## 2013-08-21 NOTE — Telephone Encounter (Signed)
Called patient with Sheila Foster for interpreter and informed patient of results. Patient stated she could come in tomorrow at 8. Reminded patient that she needs to come in fasting ( nothing to eat or drink after midnight except water). Patient verbalized understanding to all and had no further questions

## 2013-08-21 NOTE — Telephone Encounter (Signed)
Message copied by Louanna Raw on Tue Aug 21, 2013  9:22 AM ------      Message from: [place], [place] L      Created: Mon Aug 20, 2013  2:22 AM      Regarding: glucola       Glucola 151      Needs 3 hr            Hilda Lias ------

## 2013-08-22 ENCOUNTER — Other Ambulatory Visit: Payer: Medicaid Other

## 2013-08-22 DIAGNOSIS — R7309 Other abnormal glucose: Secondary | ICD-10-CM

## 2013-08-23 LAB — GLUCOSE TOLERANCE, 3 HOURS
GLUCOSE, 1 HOUR-GESTATIONAL: 168 mg/dL (ref 70–189)
Glucose Tolerance, 2 hour: 144 mg/dL (ref 70–164)
Glucose Tolerance, Fasting: 87 mg/dL (ref 70–104)
Glucose, GTT - 3 Hour: 134 mg/dL (ref 70–144)

## 2013-08-26 ENCOUNTER — Encounter: Payer: Self-pay | Admitting: Advanced Practice Midwife

## 2013-08-28 ENCOUNTER — Ambulatory Visit (INDEPENDENT_AMBULATORY_CARE_PROVIDER_SITE_OTHER): Payer: Medicaid Other | Admitting: Advanced Practice Midwife

## 2013-08-28 VITALS — BP 109/72 | HR 83 | Temp 96.4°F | Wt 183.0 lb

## 2013-08-28 DIAGNOSIS — O09529 Supervision of elderly multigravida, unspecified trimester: Secondary | ICD-10-CM

## 2013-08-28 LAB — POCT URINALYSIS DIP (DEVICE)
Bilirubin Urine: NEGATIVE
GLUCOSE, UA: NEGATIVE mg/dL
HGB URINE DIPSTICK: NEGATIVE
KETONES UR: NEGATIVE mg/dL
Leukocytes, UA: NEGATIVE
Nitrite: NEGATIVE
PH: 6.5 (ref 5.0–8.0)
Protein, ur: NEGATIVE mg/dL
Specific Gravity, Urine: 1.02 (ref 1.005–1.030)
Urobilinogen, UA: 0.2 mg/dL (ref 0.0–1.0)

## 2013-08-28 NOTE — Progress Notes (Signed)
Doing well.  Good fetal movement, denies vaginal bleeding, LOF, regular contractions.  Reviewed normal 3 hour glucose screen.  Recommend healthy diet, daily walking.  Does report intermittent cramping x2-3/hour daily. Worse with standing/walking. Recommend increase PO fluid, pregnancy support belt.  PTL signs/reasons to come to [facility] reviewed.

## 2013-08-28 NOTE — Progress Notes (Signed)
Edema- feet  Pain/pressure- lower abd  

## 2013-09-12 ENCOUNTER — Ambulatory Visit (INDEPENDENT_AMBULATORY_CARE_PROVIDER_SITE_OTHER): Payer: Medicaid Other | Admitting: Advanced Practice Midwife

## 2013-09-12 VITALS — BP 109/65 | HR 85 | Wt 182.6 lb

## 2013-09-12 DIAGNOSIS — O09529 Supervision of elderly multigravida, unspecified trimester: Secondary | ICD-10-CM

## 2013-09-12 DIAGNOSIS — K219 Gastro-esophageal reflux disease without esophagitis: Secondary | ICD-10-CM

## 2013-09-12 LAB — POCT URINALYSIS DIP (DEVICE)
Bilirubin Urine: NEGATIVE
Glucose, UA: NEGATIVE mg/dL
HGB URINE DIPSTICK: NEGATIVE
KETONES UR: NEGATIVE mg/dL
Nitrite: NEGATIVE
PROTEIN: NEGATIVE mg/dL
SPECIFIC GRAVITY, URINE: 1.02 (ref 1.005–1.030)
Urobilinogen, UA: 0.2 mg/dL (ref 0.0–1.0)
pH: 6.5 (ref 5.0–8.0)

## 2013-09-12 MED ORDER — PRENATAL MULTIVITAMIN CH: 1.0000 | ORAL_TABLET | Freq: Every day | ORAL | Status: DC

## 2013-09-12 MED ORDER — RANITIDINE HCL 150 MG PO TABS: 150.0000 mg | ORAL_TABLET | Freq: Two times a day (BID) | ORAL | Status: DC

## 2013-09-12 NOTE — Progress Notes (Signed)
I have seen this patient and agree with the above resident's note.  Zantac 150 mg BID. Will reevaluate at next visit. Pt reports she was taking PPI prior to pregnancy for acid reflux.   LEFTWICH-KIRBY, LISA Certified Nurse-Midwife

## 2013-09-12 NOTE — Progress Notes (Signed)
Has a headache which started this morning, associated with some yellow non bloody emesis X2 also feeling some heartburn Last meal at 8pm last night, chicken and zuccini and rice followed by cereal and milk this morning No other sick contacts Still able to keep fluids down  No diarrhea  +FM, no vaginal bleeding, no LOF, +CTX occasionally throughout the day, can be as close as 10 minutes apart, lasting a few seconds   Ms Edwyna Perfectrica Vest is a 38 y.o G3P2002 who presents at 32w5 for ROB Had nml 3 hr GTT  Pt rubella non immune, needs booster post partum

## 2013-09-12 NOTE — Progress Notes (Signed)
Patient reports nausea and headache this morning.

## 2013-09-26 DIAGNOSIS — N719 Inflammatory disease of uterus, unspecified: Secondary | ICD-10-CM

## 2013-09-26 HISTORY — DX: Inflammatory disease of uterus, unspecified: N71.9

## 2013-10-03 ENCOUNTER — Ambulatory Visit (INDEPENDENT_AMBULATORY_CARE_PROVIDER_SITE_OTHER): Payer: Medicaid Other | Admitting: Family Medicine

## 2013-10-03 VITALS — BP 100/67 | HR 84 | Temp 97.0°F | Wt 186.9 lb

## 2013-10-03 DIAGNOSIS — O34219 Maternal care for unspecified type scar from previous cesarean delivery: Secondary | ICD-10-CM

## 2013-10-03 DIAGNOSIS — F419 Anxiety disorder, unspecified: Secondary | ICD-10-CM

## 2013-10-03 DIAGNOSIS — O358XX0 Maternal care for other (suspected) fetal abnormality and damage, not applicable or unspecified: Secondary | ICD-10-CM

## 2013-10-03 DIAGNOSIS — F411 Generalized anxiety disorder: Secondary | ICD-10-CM

## 2013-10-03 DIAGNOSIS — O09523 Supervision of elderly multigravida, third trimester: Secondary | ICD-10-CM

## 2013-10-03 DIAGNOSIS — Z3493 Encounter for supervision of normal pregnancy, unspecified, third trimester: Secondary | ICD-10-CM

## 2013-10-03 DIAGNOSIS — Z3480 Encounter for supervision of other normal pregnancy, unspecified trimester: Secondary | ICD-10-CM

## 2013-10-03 DIAGNOSIS — O09529 Supervision of elderly multigravida, unspecified trimester: Secondary | ICD-10-CM

## 2013-10-03 DIAGNOSIS — O3421 Maternal care for scar from previous cesarean delivery: Secondary | ICD-10-CM

## 2013-10-03 DIAGNOSIS — L299 Pruritus, unspecified: Secondary | ICD-10-CM

## 2013-10-03 LAB — POCT URINALYSIS DIP (DEVICE)
BILIRUBIN URINE: NEGATIVE
Glucose, UA: NEGATIVE mg/dL
HGB URINE DIPSTICK: NEGATIVE
Ketones, ur: NEGATIVE mg/dL
NITRITE: NEGATIVE
PH: 6.5 (ref 5.0–8.0)
PROTEIN: NEGATIVE mg/dL
Specific Gravity, Urine: 1.02 (ref 1.005–1.030)
Urobilinogen, UA: 0.2 mg/dL (ref 0.0–1.0)

## 2013-10-03 LAB — COMPREHENSIVE METABOLIC PANEL
ALT: 58 U/L — ABNORMAL HIGH (ref 0–35)
AST: 41 U/L — ABNORMAL HIGH (ref 0–37)
Albumin: 3.1 g/dL — ABNORMAL LOW (ref 3.5–5.2)
Alkaline Phosphatase: 266 U/L — ABNORMAL HIGH (ref 39–117)
BILIRUBIN TOTAL: 0.4 mg/dL (ref 0.2–1.2)
BUN: 11 mg/dL (ref 6–23)
CALCIUM: 9.4 mg/dL (ref 8.4–10.5)
CHLORIDE: 104 meq/L (ref 96–112)
CO2: 21 mEq/L (ref 19–32)
Creat: 0.7 mg/dL (ref 0.50–1.10)
Glucose, Bld: 87 mg/dL (ref 70–99)
Potassium: 4.3 mEq/L (ref 3.5–5.3)
Sodium: 135 mEq/L (ref 135–145)
Total Protein: 6.3 g/dL (ref 6.0–8.3)

## 2013-10-03 LAB — OB RESULTS CONSOLE GBS: GBS: POSITIVE

## 2013-10-03 LAB — OB RESULTS CONSOLE GC/CHLAMYDIA
CHLAMYDIA, DNA PROBE: NEGATIVE
GC PROBE AMP, GENITAL: NEGATIVE

## 2013-10-03 MED ORDER — HYDROXYZINE HCL 25 MG PO TABS: 25.0000 mg | ORAL_TABLET | Freq: Four times a day (QID) | ORAL | Status: DC | PRN

## 2013-10-03 NOTE — Progress Notes (Signed)
S: 38 yo G3P2002 @ 2067w5d here for ROBV - has been having itching in her palms, feet and then now all over the body for the last week. Had 2 sisters that both got cholestasis of pregnancy.   - no ctx, lof, vb. +FM  O: see flowsheet  A/P - itching: will check bile salts and CMP -- 36 week cx today - labor precautions discussed.  - f/u in 1 week

## 2013-10-03 NOTE — Patient Instructions (Signed)
Tercer trimestre de embarazo (Third Trimester of Pregnancy) El tercer trimestre va desde la semana29 hasta la 42, desde el sptimo hasta el noveno mes, y es la poca en la que el feto crece ms rpidamente. Hacia el final del noveno mes, el feto mide alrededor de 20pulgadas (45cm) de largo y pesa entre 6 y 10 libras (2,700 y 4,500kg).  CAMBIOS EN EL ORGANISMO Su organismo atraviesa por muchos cambios durante el embarazo, y estos varan de una mujer a otra.   Seguir aumentando de peso. Es de esperar que aumente entre 25 y 35libras (11 y 16kg) hacia el final del embarazo.  Podrn aparecer las primeras estras en las caderas, el abdomen y las mamas.  Puede tener necesidad de orinar con ms frecuencia porque el feto baja hacia la pelvis y ejerce presin sobre la vejiga.  Debido al embarazo podr sentir acidez estomacal con frecuencia.  Puede estar estreida, ya que ciertas hormonas enlentecen los movimientos de los msculos que empujan los desechos a travs de los intestinos.  Pueden aparecer hemorroides o abultarse e hincharse las venas (venas varicosas).  Puede sentir dolor plvico debido al aumento de peso y a que las hormonas del embarazo relajan las articulaciones entre los huesos de la pelvis. El dolor de espalda puede ser consecuencia de la sobrecarga de los msculos que soportan la postura.  Tal vez haya cambios en el cabello que pueden incluir su engrosamiento, crecimiento rpido y cambios en la textura. Adems, a algunas mujeres se les cae el cabello durante o despus del embarazo, o tienen el cabello seco o fino. Lo ms probable es que el cabello se le normalice despus del nacimiento del beb.  Las mamas seguirn creciendo y le dolern. A veces, puede haber una secrecin amarilla de las mamas llamada calostro.  El ombligo puede salir hacia afuera.  Puede sentir que le falta el aire debido a que se expande el tero.  Puede notar que el feto "baja" o lo siente ms bajo, en el  abdomen.  Puede tener una prdida de secrecin mucosa con sangre. Esto suele ocurrir en el trmino de unos pocos das a una semana antes de que comience el trabajo de parto.  El cuello del tero se vuelve delgado y blando (se borra) cerca de la fecha de parto. QU DEBE ESPERAR EN LOS EXMENES PRENATALES  Le harn exmenes prenatales cada 2semanas hasta la semana36. A partir de ese momento le harn exmenes semanales. Durante una visita prenatal de rutina:  La pesarn para asegurarse de que usted y el feto estn creciendo normalmente.  Le tomarn la presin arterial.  Le medirn el abdomen para controlar el desarrollo del beb.  Se escucharn los latidos cardacos fetales.  Se evaluarn los resultados de los estudios solicitados en visitas anteriores.  Le revisarn el cuello del tero cuando est prxima la fecha de parto para controlar si este se ha borrado. Alrededor de la semana36, el mdico le revisar el cuello del tero. Al mismo tiempo, realizar un anlisis de las secreciones del tejido vaginal. Este examen es para determinar si hay un tipo de bacteria, estreptococo Grupo B. El mdico le explicar esto con ms detalle. El mdico puede preguntarle lo siguiente:  Cmo le gustara que fuera el parto.  Cmo se siente.  Si siente los movimientos del beb.  Si ha tenido sntomas anormales, como prdida de lquido, sangrado, dolores de cabeza intensos o clicos abdominales.  Si tiene alguna pregunta. Otros exmenes o estudios de deteccin que pueden realizarse   durante el tercer trimestre incluyen lo siguiente:  Anlisis de sangre para controlar las concentraciones de hierro (anemia).  Controles fetales para determinar su salud, nivel de actividad y crecimiento. Si tiene alguna enfermedad o hay problemas durante el embarazo, le harn estudios. FALSO TRABAJO DE PARTO Es posible que sienta contracciones leves e irregulares que finalmente desaparecen. Se llaman contracciones de  Braxton Hicks o falso trabajo de parto. Las contracciones pueden durar horas, das o incluso semanas, antes de que el verdadero trabajo de parto se inicie. Si las contracciones ocurren a intervalos regulares, se intensifican o se hacen dolorosas, lo mejor es que la revise el mdico.  SIGNOS DE TRABAJO DE PARTO   Clicos de tipo menstrual.  Contracciones cada 5minutos o menos.  Contracciones que comienzan en la parte superior del tero y se extienden hacia abajo, a la zona inferior del abdomen y la espalda.  Sensacin de mayor presin en la pelvis o dolor de espalda.  Una secrecin de mucosidad acuosa o con sangre que sale de la vagina. Si tiene alguno de estos signos antes de la semana37 del embarazo, llame a su mdico de inmediato. Debe concurrir al hospital para que la controlen inmediatamente. INSTRUCCIONES PARA EL CUIDADO EN EL HOGAR   Evite fumar, consumir hierbas, beber alcohol y tomar frmacos que no le hayan recetado. Estas sustancias qumicas afectan la formacin y el desarrollo del beb.  Siga las indicaciones del mdico en relacin con el uso de medicamentos. Durante el embarazo, hay medicamentos que son seguros de tomar y otros que no.  Haga actividad fsica solo en la forma indicada por el mdico. Sentir clicos uterinos es un buen signo para detener la actividad fsica.  Contine comiendo alimentos que sanos con regularidad.  Use un sostn que le brinde buen soporte si le duelen las mamas.  No se d baos de inmersin en agua caliente, baos turcos ni saunas.  Colquese el cinturn de seguridad cuando conduzca.  No coma carne cruda ni queso sin cocinar; evite el contacto con las bandejas sanitarias de los gatos y la tierra que estos animales usan. Estos elementos contienen grmenes que pueden causar defectos congnitos en el beb.  Tome las vitaminas prenatales.  Si est estreida, pruebe un laxante suave (si el mdico lo autoriza). Consuma ms alimentos ricos en  fibra, como vegetales y frutas frescos y cereales integrales. Beba gran cantidad de lquido para mantener la orina de tono claro o color amarillo plido.  Dese baos de asiento con agua tibia para aliviar el dolor o las molestias causadas por las hemorroides. Use una crema para las hemorroides si el mdico la autoriza.  Si tiene venas varicosas, use medias de descanso. Eleve los pies durante 15minutos, 3 o 4veces por da. Limite la cantidad de sal en su dieta.  Evite levantar objetos pesados, use zapatos de tacones bajos y mantenga una buena postura.  Descanse con las piernas elevadas si tiene calambres o dolor de cintura.  Visite a su dentista si no lo ha hecho durante el embarazo. Use un cepillo de dientes blando para higienizarse los dientes y psese el hilo dental con suavidad.  Puede seguir manteniendo relaciones sexuales, a menos que el mdico le indique lo contrario.  No haga viajes largos excepto que sea absolutamente necesario y solo con la autorizacin del mdico.  Tome clases prenatales para entender, practicar y hacer preguntas sobre el trabajo de parto y el parto.  Haga un ensayo de la partida al hospital.  Prepare el bolso que   llevar al hospital.  Prepare la habitacin del beb.  Concurra a todas las visitas prenatales segn las indicaciones de su mdico. SOLICITE ATENCIN MDICA SI:  No est segura de que est en trabajo de parto o de que ha roto la bolsa de las aguas.  Tiene mareos.  Siente clicos leves, presin en la pelvis o dolor persistente en el abdomen.  Tiene nuseas, vmitos o diarrea persistentes.  Tiene secrecin vaginal con mal olor.  Siente dolor al orinar. SOLICITE ATENCIN MDICA DE INMEDIATO SI:   Tiene fiebre.  Tiene una prdida de lquido por la vagina.  Tiene sangrado o pequeas prdidas vaginales.  Siente dolor intenso o clicos en el abdomen.  Sube o baja de peso rpidamente.  Tiene dificultad para respirar y siente dolor de  pecho.  Sbitamente se le hinchan mucho el rostro, las manos, los tobillos, los pies o las piernas.  No ha sentido los movimientos del beb durante una hora.  Siente un dolor de cabeza intenso que no se alivia con medicamentos.  Hay cambios en la visin. Document Released: 12/23/2004 Document Revised: 03/20/2013 [employer] Patient Information 2015 [employer]. This information is not intended to replace advice given to you by your health care provider. Make sure you discuss any questions you have with your health care provider.  

## 2013-10-03 NOTE — Progress Notes (Signed)
Cultures today.  Pt describes itching in hands, feet, and all over her body.  Her sisters had the same issue and were diagnosed with Cholestasias of pregnancy

## 2013-10-04 LAB — GC/CHLAMYDIA PROBE AMP
CT PROBE, AMP APTIMA: NEGATIVE
GC Probe RNA: NEGATIVE

## 2013-10-04 LAB — CULTURE, BETA STREP (GROUP B ONLY)

## 2013-10-04 LAB — BILE ACIDS, TOTAL: BILE ACIDS TOTAL: 22 umol/L — AB (ref 0–19)

## 2013-10-05 ENCOUNTER — Ambulatory Visit (HOSPITAL_COMMUNITY)
Admission: RE | Admit: 2013-10-05 | Discharge: 2013-10-05 | Disposition: A | Discharge status: Home / Self Care | Payer: Medicaid Other | Source: Ambulatory Visit | Attending: Family Medicine | Admitting: Family Medicine

## 2013-10-05 ENCOUNTER — Encounter (HOSPITAL_COMMUNITY): Payer: Self-pay

## 2013-10-05 ENCOUNTER — Encounter: Payer: Self-pay | Admitting: Family Medicine

## 2013-10-05 ENCOUNTER — Telehealth: Payer: Self-pay | Admitting: *Deleted

## 2013-10-05 DIAGNOSIS — O26619 Liver and biliary tract disorders in pregnancy, unspecified trimester: Secondary | ICD-10-CM | POA: Diagnosis not present

## 2013-10-05 DIAGNOSIS — B069 Rubella without complication: Secondary | ICD-10-CM | POA: Insufficient documentation

## 2013-10-05 DIAGNOSIS — O358XX0 Maternal care for other (suspected) fetal abnormality and damage, not applicable or unspecified: Secondary | ICD-10-CM | POA: Diagnosis not present

## 2013-10-05 DIAGNOSIS — O98519 Other viral diseases complicating pregnancy, unspecified trimester: Secondary | ICD-10-CM | POA: Diagnosis not present

## 2013-10-05 DIAGNOSIS — K838 Other specified diseases of biliary tract: Secondary | ICD-10-CM | POA: Diagnosis not present

## 2013-10-05 DIAGNOSIS — O26613 Liver and biliary tract disorders in pregnancy, third trimester: Secondary | ICD-10-CM

## 2013-10-05 DIAGNOSIS — K831 Obstruction of bile duct: Secondary | ICD-10-CM | POA: Insufficient documentation

## 2013-10-05 MED ORDER — URSODIOL 500 MG PO TABS: 500.0000 mg | ORAL_TABLET | Freq: Two times a day (BID) | ORAL | Status: DC

## 2013-10-05 NOTE — ED Notes (Signed)
Pt very comfortable with contractions.  Labor precautions rev'd, pt voices understanding.

## 2013-10-05 NOTE — Telephone Encounter (Addendum)
Message copied by Jill SideAY, Victor Langenbach L on Fri Oct 05, 2013 12:17 PM ------      Message from: Faythe CasaBELLAMY, JEANETTA M      Created: Fri Oct 05, 2013 11:50 AM                   ----- Message -----         From: Vale HavenKeli L Beck, MD         Sent: 10/05/2013   9:03 AM           To: [facility]            Hey, this pt has cholestasis of pregnancy.  Can we call in an RX of ursodiol BID for her and have her set up in twice weekly testing (needs it today if possible and then on Monday)? She will also need IOL for 37 weeks.  ------  Called pt w/[employer] interpreter # [phone] and got voice mail. Will try again  1235  Called pt again w/[employer] Interpreter # [phone] and left message that a new medication has been sent to her pharmacy- please pick it up today. Also please come to clinic on 7/13 @ 3pm for appt.

## 2013-10-08 ENCOUNTER — Ambulatory Visit (INDEPENDENT_AMBULATORY_CARE_PROVIDER_SITE_OTHER): Payer: Medicaid Other | Admitting: *Deleted

## 2013-10-08 ENCOUNTER — Telehealth: Payer: Self-pay

## 2013-10-08 VITALS — BP 126/70 | HR 81

## 2013-10-08 DIAGNOSIS — K831 Obstruction of bile duct: Secondary | ICD-10-CM

## 2013-10-08 DIAGNOSIS — O26613 Liver and biliary tract disorders in pregnancy, third trimester: Principal | ICD-10-CM

## 2013-10-08 DIAGNOSIS — O26619 Liver and biliary tract disorders in pregnancy, unspecified trimester: Secondary | ICD-10-CM

## 2013-10-08 DIAGNOSIS — K838 Other specified diseases of biliary tract: Secondary | ICD-10-CM

## 2013-10-08 MED ORDER — URSODIOL 300 MG PO CAPS: 300.0000 mg | ORAL_CAPSULE | Freq: Three times a day (TID) | ORAL | Status: DC

## 2013-10-08 NOTE — Telephone Encounter (Signed)
Called patient with interpreter [place]. Informed patient of appointment for NST today at 1500-- patient states she can make it-- advised patient to arrive at 1445. Patient also stated she went to pharmacy to pick up prescription and they were only able to give her 4 pills for the weekend as the medication needs to be authorized. Informed patient we would look into problem. Patient verbalized understanding and stated she will be here this afternoon.

## 2013-10-08 NOTE — Progress Notes (Signed)
NST reviewed and reactive.  

## 2013-10-08 NOTE — Telephone Encounter (Signed)
Patient called stating she was called regarding a RX and appointment Monday and would like to know what they are for.   Confirmed with Diane Day, RNC that if patient could come in today 10/08/13 at 1500 for NST then patient should, if she cannot Tuesday 10/09/13 at 1500 is OK.

## 2013-10-08 NOTE — Telephone Encounter (Signed)
Called [facility] pharmacy-- told to call [address] [phone]. Informed Ursidiol tablets are not covered under medicaid, however, capsules are. Discussed with Dr. Reola CalkinsBeck who agreed to order capsules as they are covered. Ursidiol 300mg  capsule TID e-prescribed. Patient informed.

## 2013-10-09 ENCOUNTER — Other Ambulatory Visit: Payer: Medicaid Other

## 2013-10-10 ENCOUNTER — Ambulatory Visit (INDEPENDENT_AMBULATORY_CARE_PROVIDER_SITE_OTHER): Payer: Medicaid Other | Admitting: Family

## 2013-10-10 ENCOUNTER — Telehealth (HOSPITAL_COMMUNITY): Payer: Self-pay | Admitting: *Deleted

## 2013-10-10 ENCOUNTER — Encounter (HOSPITAL_COMMUNITY): Payer: Self-pay | Admitting: *Deleted

## 2013-10-10 ENCOUNTER — Encounter: Payer: Self-pay | Admitting: Family

## 2013-10-10 VITALS — BP 103/70 | HR 82 | Temp 96.8°F | Wt 188.3 lb

## 2013-10-10 DIAGNOSIS — O09523 Supervision of elderly multigravida, third trimester: Secondary | ICD-10-CM

## 2013-10-10 DIAGNOSIS — O09529 Supervision of elderly multigravida, unspecified trimester: Secondary | ICD-10-CM

## 2013-10-10 DIAGNOSIS — K838 Other specified diseases of biliary tract: Secondary | ICD-10-CM

## 2013-10-10 DIAGNOSIS — K831 Obstruction of bile duct: Secondary | ICD-10-CM

## 2013-10-10 DIAGNOSIS — O26613 Liver and biliary tract disorders in pregnancy, third trimester: Principal | ICD-10-CM

## 2013-10-10 DIAGNOSIS — O26619 Liver and biliary tract disorders in pregnancy, unspecified trimester: Secondary | ICD-10-CM

## 2013-10-10 LAB — POCT URINALYSIS DIP (DEVICE)
Bilirubin Urine: NEGATIVE
Glucose, UA: NEGATIVE mg/dL
Hgb urine dipstick: NEGATIVE
Ketones, ur: NEGATIVE mg/dL
NITRITE: NEGATIVE
Protein, ur: NEGATIVE mg/dL
Specific Gravity, Urine: 1.015 (ref 1.005–1.030)
UROBILINOGEN UA: 0.2 mg/dL (ref 0.0–1.0)
pH: 7 (ref 5.0–8.0)

## 2013-10-10 LAB — FETAL NONSTRESS TEST

## 2013-10-10 NOTE — Telephone Encounter (Signed)
Preadmission screen Interpreter number [phone]

## 2013-10-10 NOTE — Progress Notes (Signed)
Doing well; no questions or concerns.  Pt scheduled for IOL due to cholestasis in pregnancy.  Prior csection.  Cervix 1/thick.  Foley bulb possible per this clinician.  Explained to patient that if not possible, unable to give cytotec and csection may be required.

## 2013-10-10 NOTE — Progress Notes (Signed)
No complaints at this visit.

## 2013-10-10 NOTE — Progress Notes (Signed)
IOL scheduled on 7/17 @ 0630 per protocol

## 2013-10-12 ENCOUNTER — Encounter (HOSPITAL_COMMUNITY): Payer: Medicaid Other | Admitting: Anesthesiology

## 2013-10-12 ENCOUNTER — Inpatient Hospital Stay (HOSPITAL_COMMUNITY): Payer: Medicaid Other | Admitting: Anesthesiology

## 2013-10-12 ENCOUNTER — Encounter (HOSPITAL_COMMUNITY): Payer: Self-pay

## 2013-10-12 ENCOUNTER — Inpatient Hospital Stay (HOSPITAL_COMMUNITY)
Admission: RE | Admit: 2013-10-12 | Discharge: 2013-10-14 | DRG: 774 | Disposition: A | Discharge status: Home / Self Care | Payer: Medicaid Other | Source: Ambulatory Visit | Attending: Obstetrics and Gynecology | Admitting: Obstetrics and Gynecology

## 2013-10-12 VITALS — BP 103/58 | HR 70 | Temp 98.0°F | Resp 18 | Ht 64.0 in | Wt 188.0 lb

## 2013-10-12 DIAGNOSIS — Z3490 Encounter for supervision of normal pregnancy, unspecified, unspecified trimester: Secondary | ICD-10-CM

## 2013-10-12 DIAGNOSIS — Z833 Family history of diabetes mellitus: Secondary | ICD-10-CM | POA: Diagnosis not present

## 2013-10-12 DIAGNOSIS — O09529 Supervision of elderly multigravida, unspecified trimester: Secondary | ICD-10-CM | POA: Diagnosis present

## 2013-10-12 DIAGNOSIS — Z8249 Family history of ischemic heart disease and other diseases of the circulatory system: Secondary | ICD-10-CM

## 2013-10-12 DIAGNOSIS — O26619 Liver and biliary tract disorders in pregnancy, unspecified trimester: Secondary | ICD-10-CM | POA: Diagnosis present

## 2013-10-12 DIAGNOSIS — O34219 Maternal care for unspecified type scar from previous cesarean delivery: Secondary | ICD-10-CM | POA: Diagnosis not present

## 2013-10-12 DIAGNOSIS — O99892 Other specified diseases and conditions complicating childbirth: Secondary | ICD-10-CM | POA: Diagnosis present

## 2013-10-12 DIAGNOSIS — Z22330 Carrier of Group B streptococcus: Secondary | ICD-10-CM

## 2013-10-12 DIAGNOSIS — O8612 Endometritis following delivery: Secondary | ICD-10-CM

## 2013-10-12 DIAGNOSIS — O99344 Other mental disorders complicating childbirth: Secondary | ICD-10-CM | POA: Diagnosis present

## 2013-10-12 DIAGNOSIS — O358XX0 Maternal care for other (suspected) fetal abnormality and damage, not applicable or unspecified: Secondary | ICD-10-CM

## 2013-10-12 DIAGNOSIS — D649 Anemia, unspecified: Secondary | ICD-10-CM | POA: Diagnosis present

## 2013-10-12 DIAGNOSIS — O09899 Supervision of other high risk pregnancies, unspecified trimester: Secondary | ICD-10-CM

## 2013-10-12 DIAGNOSIS — O864 Pyrexia of unknown origin following delivery: Secondary | ICD-10-CM | POA: Diagnosis not present

## 2013-10-12 DIAGNOSIS — F411 Generalized anxiety disorder: Secondary | ICD-10-CM | POA: Diagnosis present

## 2013-10-12 DIAGNOSIS — K831 Obstruction of bile duct: Secondary | ICD-10-CM | POA: Diagnosis present

## 2013-10-12 DIAGNOSIS — Z283 Underimmunization status: Secondary | ICD-10-CM

## 2013-10-12 DIAGNOSIS — O9902 Anemia complicating childbirth: Secondary | ICD-10-CM | POA: Diagnosis present

## 2013-10-12 DIAGNOSIS — O26613 Liver and biliary tract disorders in pregnancy, third trimester: Secondary | ICD-10-CM

## 2013-10-12 DIAGNOSIS — O9989 Other specified diseases and conditions complicating pregnancy, childbirth and the puerperium: Secondary | ICD-10-CM

## 2013-10-12 DIAGNOSIS — K838 Other specified diseases of biliary tract: Secondary | ICD-10-CM | POA: Diagnosis present

## 2013-10-12 LAB — TYPE AND SCREEN
ABO/RH(D): A POS
ANTIBODY SCREEN: NEGATIVE

## 2013-10-12 LAB — CBC
HCT: 34.3 % — ABNORMAL LOW (ref 36.0–46.0)
HEMOGLOBIN: 11.5 g/dL — AB (ref 12.0–15.0)
MCH: 28.5 pg (ref 26.0–34.0)
MCHC: 33.5 g/dL (ref 30.0–36.0)
MCV: 85.1 fL (ref 78.0–100.0)
Platelets: 155 10*3/uL (ref 150–400)
RBC: 4.03 MIL/uL (ref 3.87–5.11)
RDW: 15.6 % — ABNORMAL HIGH (ref 11.5–15.5)
WBC: 8.9 10*3/uL (ref 4.0–10.5)

## 2013-10-12 LAB — SAMPLE TO BLOOD BANK

## 2013-10-12 LAB — RPR

## 2013-10-12 MED ORDER — FENTANYL 2.5 MCG/ML BUPIVACAINE 1/10 % EPIDURAL INFUSION (WH - ANES)
14.0000 mL/h | INTRAMUSCULAR | Status: DC | PRN
  Filled 2013-10-12: qty 125

## 2013-10-12 MED ORDER — OXYTOCIN BOLUS FROM INFUSION
500.0000 mL | INTRAVENOUS | Status: DC
  Administered 2013-10-12: 500 mL via INTRAVENOUS

## 2013-10-12 MED ORDER — ONDANSETRON HCL 4 MG/2ML IJ SOLN
4.0000 mg | Freq: Four times a day (QID) | INTRAMUSCULAR | Status: DC | PRN
  Administered 2013-10-12: 4 mg via INTRAVENOUS
  Filled 2013-10-12: qty 2

## 2013-10-12 MED ORDER — LIDOCAINE HCL (PF) 1 % IJ SOLN
INTRAMUSCULAR | Status: DC | PRN
  Administered 2013-10-12 (×2): 4 mL

## 2013-10-12 MED ORDER — TERBUTALINE SULFATE 1 MG/ML IJ SOLN: 0.2500 mg | Freq: Once | INTRAMUSCULAR | Status: AC | PRN

## 2013-10-12 MED ORDER — LACTATED RINGERS IV SOLN: 500.0000 mL | INTRAVENOUS | Status: DC | PRN

## 2013-10-12 MED ORDER — DIPHENHYDRAMINE HCL 50 MG/ML IJ SOLN: 12.5000 mg | INTRAMUSCULAR | Status: DC | PRN

## 2013-10-12 MED ORDER — OXYTOCIN 40 UNITS IN LACTATED RINGERS INFUSION - SIMPLE MED
62.5000 mL/h | INTRAVENOUS | Status: DC
  Filled 2013-10-12 (×2): qty 1000

## 2013-10-12 MED ORDER — EPHEDRINE 5 MG/ML INJ
10.0000 mg | INTRAVENOUS | Status: DC | PRN
  Filled 2013-10-12: qty 2

## 2013-10-12 MED ORDER — LACTATED RINGERS IV SOLN: 500.0000 mL | Freq: Once | INTRAVENOUS | Status: DC

## 2013-10-12 MED ORDER — PHENYLEPHRINE 40 MCG/ML (10ML) SYRINGE FOR IV PUSH (FOR BLOOD PRESSURE SUPPORT)
80.0000 ug | PREFILLED_SYRINGE | INTRAVENOUS | Status: DC | PRN
  Filled 2013-10-12: qty 2
  Filled 2013-10-12: qty 10

## 2013-10-12 MED ORDER — PENICILLIN G POTASSIUM 5000000 UNITS IJ SOLR
2.5000 10*6.[IU] | INTRAVENOUS | Status: DC
  Administered 2013-10-12 (×3): 2.5 10*6.[IU] via INTRAVENOUS
  Filled 2013-10-12 (×8): qty 2.5

## 2013-10-12 MED ORDER — FENTANYL 2.5 MCG/ML BUPIVACAINE 1/10 % EPIDURAL INFUSION (WH - ANES)
INTRAMUSCULAR | Status: DC | PRN
  Administered 2013-10-12: 14 mL/h via EPIDURAL

## 2013-10-12 MED ORDER — PENICILLIN G POTASSIUM 5000000 UNITS IJ SOLR
5.0000 10*6.[IU] | Freq: Once | INTRAMUSCULAR | Status: AC
  Administered 2013-10-12: 5 10*6.[IU] via INTRAVENOUS
  Filled 2013-10-12: qty 5

## 2013-10-12 MED ORDER — OXYCODONE-ACETAMINOPHEN 5-325 MG PO TABS
1.0000 | ORAL_TABLET | ORAL | Status: DC | PRN
  Administered 2013-10-13: 2 via ORAL
  Filled 2013-10-12: qty 2

## 2013-10-12 MED ORDER — EPHEDRINE 5 MG/ML INJ
10.0000 mg | INTRAVENOUS | Status: DC | PRN
  Filled 2013-10-12: qty 4
  Filled 2013-10-12: qty 2

## 2013-10-12 MED ORDER — MISOPROSTOL 200 MCG PO TABS
ORAL_TABLET | ORAL | Status: AC
  Administered 2013-10-12: 800 ug via ORAL
  Filled 2013-10-12: qty 4

## 2013-10-12 MED ORDER — LIDOCAINE HCL (PF) 1 % IJ SOLN
30.0000 mL | INTRAMUSCULAR | Status: DC | PRN
  Filled 2013-10-12: qty 30

## 2013-10-12 MED ORDER — PHENYLEPHRINE 40 MCG/ML (10ML) SYRINGE FOR IV PUSH (FOR BLOOD PRESSURE SUPPORT)
80.0000 ug | PREFILLED_SYRINGE | INTRAVENOUS | Status: DC | PRN
Start: 2013-10-12 — End: 2013-10-13
  Filled 2013-10-12: qty 2

## 2013-10-12 MED ORDER — MISOPROSTOL 200 MCG PO TABS
800.0000 ug | ORAL_TABLET | Freq: Once | ORAL | Status: AC
  Administered 2013-10-12: 800 ug via ORAL

## 2013-10-12 MED ORDER — LACTATED RINGERS IV SOLN
INTRAVENOUS | Status: DC
  Administered 2013-10-12: 21:00:00 via INTRAVENOUS

## 2013-10-12 MED ORDER — MISOPROSTOL 200 MCG PO TABS: 800.0000 ug | ORAL_TABLET | Freq: Once | ORAL | Status: DC

## 2013-10-12 MED ORDER — LACTATED RINGERS IV SOLN
INTRAVENOUS | Status: DC
  Administered 2013-10-12: 08:00:00 via INTRAVENOUS

## 2013-10-12 MED ORDER — OXYTOCIN 40 UNITS IN LACTATED RINGERS INFUSION - SIMPLE MED
1.0000 m[IU]/min | INTRAVENOUS | Status: DC
  Administered 2013-10-12: 2 m[IU]/min via INTRAVENOUS

## 2013-10-12 MED ORDER — ACETAMINOPHEN 325 MG PO TABS
650.0000 mg | ORAL_TABLET | ORAL | Status: DC | PRN
  Administered 2013-10-12: 650 mg via ORAL
  Filled 2013-10-12: qty 2

## 2013-10-12 MED ORDER — CITRIC ACID-SODIUM CITRATE 334-500 MG/5ML PO SOLN
30.0000 mL | ORAL | Status: DC | PRN
  Administered 2013-10-12: 30 mL via ORAL
  Filled 2013-10-12: qty 15

## 2013-10-12 MED ORDER — IBUPROFEN 600 MG PO TABS
600.0000 mg | ORAL_TABLET | Freq: Four times a day (QID) | ORAL | Status: DC | PRN
  Administered 2013-10-13: 600 mg via ORAL
  Filled 2013-10-12: qty 1

## 2013-10-12 MED ORDER — FLEET ENEMA 7-19 GM/118ML RE ENEM: 1.0000 | ENEMA | RECTAL | Status: DC | PRN

## 2013-10-12 NOTE — Progress Notes (Signed)
Sheila Foster is a 38 y.o. G3P2002 at 1039w0d   Subjective: Comfortable with epidural; feeling fluid leaking from vag  Objective: BP 128/82  Pulse 69  Temp(Src) 98.7 F (37.1 C) (Oral)  Resp 18  Ht 5\' 4"  (1.626 m)  Wt 85.276 kg (188 lb)  BMI 32.25 kg/m2  SpO2 97%  LMP 01/26/2013   Total I/O In: -  Out: 350 [Urine:350]  FHT:  FHR: 130-140 bpm, variability: moderate,  accelerations:  Present,  decelerations:  Absent UC:   regular, every 2-4 minutes with Pit @ 4810mu/min SVE:   Dilation: 8 Effacement (%): 100 Station: 0 Exam by:: Philipp DeputyKim Alijah Hyde CNM- copious fluid seen -> SROM  Labs: Lab Results  Component Value Date   WBC 8.9 10/12/2013   HGB 11.5* 10/12/2013   HCT 34.3* 10/12/2013   MCV 85.1 10/12/2013   PLT 155 10/12/2013    Assessment / Plan: Active labor/transition Cholestasis  Await urge to push, or reexamine cx in 2 hours if remains comfortable.  Cam HaiSHAW, Ericka Marcellus CNM 10/12/2013, 10:55 PM

## 2013-10-12 NOTE — Anesthesia Preprocedure Evaluation (Signed)
Anesthesia Evaluation    Airway Mallampati: III TM Distance: >3 FB Neck ROM: Full    Dental no notable dental hx. (+) Teeth Intact   Pulmonary  breath sounds clear to auscultation  Pulmonary exam normal       Cardiovascular negative cardio ROS  Rhythm:Regular Rate:Normal     Neuro/Psych  Headaches, PSYCHIATRIC DISORDERS Anxiety  Neuromuscular disease    GI/Hepatic GERD-  Medicated and Controlled,Cholestasis of pregnancy   Endo/Other  diabetes  Renal/GU      Musculoskeletal negative musculoskeletal ROS (+)   Abdominal (+) + obese,   Peds  Hematology  (+) anemia ,   Anesthesia Other Findings   Reproductive/Obstetrics (+) Pregnancy Previous C/Section TOLAC                           Anesthesia Physical Anesthesia Plan  ASA: II  Anesthesia Plan: Epidural   Post-op Pain Management:    Induction:   Airway Management Planned: Natural Airway  Additional Equipment:   Intra-op Plan:   Post-operative Plan:   Informed Consent: I have reviewed the patients History and Physical, chart, labs and discussed the procedure including the risks, benefits and alternatives for the proposed anesthesia with the patient or authorized representative who has indicated his/her understanding and acceptance.     Plan Discussed with: Anesthesiologist  Anesthesia Plan Comments:         Anesthesia Quick Evaluation

## 2013-10-12 NOTE — Progress Notes (Signed)
Sheila Foster is a 38 y.o. G3P2002 at 2964w0d by ultrasound admitted for induction of labor due to cholestasis of pregnancy.  Subjective: Called to bedside when foley bulb came loose. Patient feeling well. No complaints. No contractions.  Objective: BP 125/76  Pulse 70  Temp(Src) 97.9 F (36.6 C) (Oral)  Resp 18  Ht 5\' 4"  (1.626 m)  Wt 85.276 kg (188 lb)  BMI 32.25 kg/m2  LMP 01/26/2013      FHT:  FHR: 140 bpm, variability: moderate,  accelerations:  Present,  decelerations:  Absent UC:   irregular, every 7-10 minutes SVE:   Dilation: 3 Effacement (%): 50 Station: -2 Exam by:: dr. Zonia Kiefstephens  Labs: Lab Results  Component Value Date   WBC 8.9 10/12/2013   HGB 11.5* 10/12/2013   HCT 34.3* 10/12/2013   MCV 85.1 10/12/2013   PLT 155 10/12/2013    Assessment / Plan: Induction of labor due to cholestasis of pregnancy. Patient has Bishop's score of 7, progressing well. Start Pitocin at 2 units, titrate to adequate contraction pattern. Patient may call for epidural once contractions become uncomfortable.  Labor: Progressing normally Preeclampsia:  None Fetal Wellbeing:  Category I Pain Control:  Epidural I/D:  n/a Anticipated MOD:  NSVD  Markus JarvisStephens, Gustavo Meditz A 10/12/2013, 1:20 PM

## 2013-10-12 NOTE — Anesthesia Procedure Notes (Signed)
Epidural Patient location during procedure: OB Start time: 10/12/2013 5:55 PM  Staffing Anesthesiologist: Atharva Mirsky A. Performed by: anesthesiologist   Preanesthetic Checklist Completed: patient identified, site marked, surgical consent, pre-op evaluation, timeout performed, IV checked, risks and benefits discussed and monitors and equipment checked  Epidural Patient position: sitting Prep: site prepped and draped and DuraPrep Patient monitoring: continuous pulse ox and blood pressure Approach: midline Location: L3-L4 Injection technique: LOR air  Needle:  Needle type: Tuohy  Needle gauge: 17 G Needle length: 9 cm and 9 Needle insertion depth: 5 cm cm Catheter type: closed end flexible Catheter size: 19 Gauge Catheter at skin depth: 10 cm Test dose: negative and Other  Assessment Events: blood not aspirated, injection not painful, no injection resistance, negative IV test and no paresthesia  Additional Notes Patient identified. Risks and benefits discussed including failed block, incomplete  Pain control, post dural puncture headache, nerve damage, paralysis, blood pressure Changes, nausea, vomiting, reactions to medications-both toxic and allergic and post Partum back pain. All questions were answered. Patient expressed understanding and wished to proceed. Sterile technique was used throughout procedure. Epidural site was Dressed with sterile barrier dressing. No paresthesias, signs of intravascular injection Or signs of intrathecal spread were encountered.  Patient was more comfortable after the epidural was dosed. Please see RN's note for documentation of vital signs and FHR which are stable.

## 2013-10-12 NOTE — H&P (Signed)
Sheila Foster is a 38 y.o. female G3P2002 with IUP at 2516w0d presenting for IOL for cholestasis. Membranes are intact, with active fetal movement.   [facility]  Prenatal History/Complications: Cholestasis 1L85 DSR on AFP; declined cfDNA C/S with last birth for NRFHT  Past Medical History: Past Medical History  Diagnosis Date  . Constipation   . Shingles 2010  . Contraception     condoms   . Cholelithiasis affecting pregnancy in third trimester, antepartum     Past Surgical History: Past Surgical History  Procedure Laterality Date  . Cesarean section      x1    Obstetrical History: OB History   Grav Para Term Preterm Abortions TAB SAB Ect Mult Living   3 2 2       2       Social History: History   Social History  . Marital Status: Married    Spouse Name: N/A    Number of Children: 2  . Years of Education: N/A   Occupational History  . housekeeper     Social History Main Topics  . Smoking status: Never Smoker   . Smokeless tobacco: Never Used  . Alcohol Use: No  . Drug Use: No  . Sexual Activity: Yes    Birth Control/ Protection: Condom   Other Topics Concern  . None   Social History Narrative   Education 9th grade   [place] from [place]    Family History: Family History  Problem Relation Age of Onset  . Heart disease Father     MI age 160  . Hypertension Father   . Diabetes Father   . Colon cancer Neg Hx   . Breast cancer Neg Hx     Allergies: No Known Allergies  Prescriptions prior to admission  Medication Sig Dispense Refill  . hydrOXYzine (ATARAX/VISTARIL) 25 MG tablet Take 1 tablet (25 mg total) by mouth every 6 (six) hours as needed for itching.  30 tablet  2  . Prenatal Vit-Fe Fumarate-FA (PRENATAL MULTIVITAMIN) TABS tablet Take 1 tablet by mouth daily at 12 noon.  30 tablet  10  . ranitidine (ZANTAC) 150 MG tablet Take 1 tablet (150 mg total) by mouth 2 (two) times daily.  60 tablet  5  . ursodiol (ACTIGALL) 300 MG  capsule Take 1 capsule (300 mg total) by mouth 3 (three) times daily.  15 capsule  0  . valACYclovir (VALTREX) 500 MG tablet Take 1 tablet (500 mg total) by mouth 2 (two) times daily.  20 tablet  0     Prenatal Transfer Tool  Maternal Diabetes: No Genetic Screening: Abnormal:  Results: Elevated risk of Trisomy 21 Maternal Ultrasounds/Referrals: Normal Fetal Ultrasounds or other Referrals:  None Maternal Substance Abuse:  No Significant Maternal Medications:  None Significant Maternal Lab Results: Lab values include: Group B Strep positive     Review of Systems   Constitutional: Negative for fever and chills Eyes: Negative for visual disturbances Respiratory: Negative for shortness of breath, dyspnea Cardiovascular: Negative for chest pain or palpitations  Gastrointestinal: Negative for vomiting, diarrhea and constipation Genitourinary: Negative for dysuria and urgency Musculoskeletal: Negative for back pain, joint pain, myalgias  Neurological: Negative for dizziness and headaches     Blood pressure 125/82, pulse 102, temperature 97.8 F (36.6 C), temperature source Oral, resp. rate 18, height 5\' 4"  (1.626 m), weight 85.276 kg (188 lb), last menstrual period 01/26/2013. General appearance: alert, cooperative and no distress Lungs: clear to auscultation bilaterally  Heart: regular rate and rhythm Abdomen: soft, non-tender; bowel sounds normal Pelvic: 1/thick/-2/vtx.  FOley inserted and inflated with 60cc H20 Extremities: Homans sign is negative, no sign of DVT DTR's 2+ Presentation: cephalic Fetal monitoringBaseline: 140 bpm, Variability: Good {> 6 bpm), Accelerations: Reactive and Decelerations: Absent Uterine activityNone     Prenatal labs: ABO, Rh: --/--/A POS (01/14 1356) Antibody:  neg Rubella: 0.23 (03/17 1356) RPR: NON REAC (05/19 1328)  HBsAg: NEGATIVE (01/21 0930)  HIV: NONREACTIVE (05/19 1328)  GBS: Positive (07/08 0000)  1 hr Glucola 151 Genetic  screening  1:85 DSR, delicned NIPS Anatomy US normal   No results found for this or any previous visit (from the past 24 hour(s)).  Assessment: Sheila Foster is a 38 y.o. G3P2002 with an IUP at [redacted]w[redacted]d presenting for IOL for cholestasis  Plan: #Labor: foley>pitocin #Pain:  undecided #FWB Cat 1 #ID: GBS: POS; tx with PCN #MOF:  breast    CRESENZO-DISHMAN,Dai Apel 10/12/2013, 7:30 AM

## 2013-10-12 NOTE — Progress Notes (Signed)
Edwyna Perfectrica Hinostroza is a 38 y.o. G3P2002 at 774w0d by ultrasound admitted for induction of labor due to cholestasis.  Subjective: Doing well  Objective: BP 105/69  Pulse 67  Temp(Src) 98.2 F (36.8 C) (Oral)  Resp 16  Ht 5\' 4"  (1.626 m)  Wt 85.276 kg (188 lb)  BMI 32.25 kg/m2  SpO2 97%  LMP 01/26/2013      FHT:  FHR: 135 bpm, variability: moderate,  accelerations:  Present,  decelerations:  Present Intermittent variable decels with UCs UC:   irregular, every 2-6 minutes SVE:   Dilation: 4 Effacement (%): 50 Station: -2 Exam by:: hk  Labs: Lab Results  Component Value Date   WBC 8.9 10/12/2013   HGB 11.5* 10/12/2013   HCT 34.3* 10/12/2013   MCV 85.1 10/12/2013   PLT 155 10/12/2013    Assessment / Plan: Induction of labor due to cholestasis,  progressing well on pitocin  Labor: Progressing slowly through latent phase Preeclampsia:  n/a Fetal Wellbeing:  Category II Pain Control:  Epidural I/D:  n/a Anticipated MOD:  NSVD  Paulene Tayag 10/12/2013, 7:05 PM

## 2013-10-13 ENCOUNTER — Encounter (HOSPITAL_COMMUNITY): Payer: Self-pay

## 2013-10-13 DIAGNOSIS — O8612 Endometritis following delivery: Secondary | ICD-10-CM | POA: Diagnosis present

## 2013-10-13 LAB — CBC WITH DIFFERENTIAL/PLATELET
Basophils Absolute: 0 10*3/uL (ref 0.0–0.1)
Basophils Relative: 0 % (ref 0–1)
EOS ABS: 0 10*3/uL (ref 0.0–0.7)
Eosinophils Relative: 0 % (ref 0–5)
HCT: 30.1 % — ABNORMAL LOW (ref 36.0–46.0)
HEMOGLOBIN: 10.2 g/dL — AB (ref 12.0–15.0)
LYMPHS ABS: 2.2 10*3/uL (ref 0.7–4.0)
Lymphocytes Relative: 14 % (ref 12–46)
MCH: 28.4 pg (ref 26.0–34.0)
MCHC: 33.9 g/dL (ref 30.0–36.0)
MCV: 83.8 fL (ref 78.0–100.0)
MONOS PCT: 7 % (ref 3–12)
Monocytes Absolute: 1 10*3/uL (ref 0.1–1.0)
NEUTROS PCT: 79 % — AB (ref 43–77)
Neutro Abs: 12 10*3/uL — ABNORMAL HIGH (ref 1.7–7.7)
Platelets: 115 10*3/uL — ABNORMAL LOW (ref 150–400)
RBC: 3.59 MIL/uL — AB (ref 3.87–5.11)
RDW: 15.7 % — ABNORMAL HIGH (ref 11.5–15.5)
WBC: 15.3 10*3/uL — ABNORMAL HIGH (ref 4.0–10.5)

## 2013-10-13 MED ORDER — PENICILLIN G POTASSIUM 5000000 UNITS IJ SOLR
4.0000 10*6.[IU] | INTRAVENOUS | Status: DC
  Filled 2013-10-13 (×5): qty 4

## 2013-10-13 MED ORDER — BENZOCAINE-MENTHOL 20-0.5 % EX AERO: 1.0000 "application " | INHALATION_SPRAY | CUTANEOUS | Status: DC | PRN

## 2013-10-13 MED ORDER — IBUPROFEN 600 MG PO TABS
600.0000 mg | ORAL_TABLET | Freq: Four times a day (QID) | ORAL | Status: DC
  Administered 2013-10-13 – 2013-10-14 (×6): 600 mg via ORAL
  Filled 2013-10-13 (×6): qty 1

## 2013-10-13 MED ORDER — HYDROXYZINE HCL 25 MG PO TABS
25.0000 mg | ORAL_TABLET | Freq: Four times a day (QID) | ORAL | Status: DC | PRN
  Filled 2013-10-13: qty 1

## 2013-10-13 MED ORDER — ONDANSETRON HCL 4 MG/2ML IJ SOLN
4.0000 mg | INTRAMUSCULAR | Status: DC | PRN
Start: 2013-10-13 — End: 2013-10-14

## 2013-10-13 MED ORDER — OXYTOCIN 40 UNITS IN LACTATED RINGERS INFUSION - SIMPLE MED
999.0000 m[IU]/min | INTRAVENOUS | Status: DC
  Administered 2013-10-13: 999 m[IU]/min via INTRAVENOUS

## 2013-10-13 MED ORDER — SENNOSIDES-DOCUSATE SODIUM 8.6-50 MG PO TABS
2.0000 | ORAL_TABLET | ORAL | Status: DC
  Administered 2013-10-13: 2 via ORAL
  Filled 2013-10-13: qty 2

## 2013-10-13 MED ORDER — PANTOPRAZOLE SODIUM 40 MG PO TBEC
40.0000 mg | DELAYED_RELEASE_TABLET | Freq: Every day | ORAL | Status: DC
  Administered 2013-10-13: 40 mg via ORAL
  Filled 2013-10-13: qty 1

## 2013-10-13 MED ORDER — ZOLPIDEM TARTRATE 5 MG PO TABS: 5.0000 mg | ORAL_TABLET | Freq: Every evening | ORAL | Status: DC | PRN

## 2013-10-13 MED ORDER — DIBUCAINE 1 % RE OINT: 1.0000 "application " | TOPICAL_OINTMENT | RECTAL | Status: DC | PRN

## 2013-10-13 MED ORDER — CLINDAMYCIN PHOSPHATE 900 MG/50ML IV SOLN
900.0000 mg | Freq: Three times a day (TID) | INTRAVENOUS | Status: DC
  Administered 2013-10-13 – 2013-10-14 (×4): 900 mg via INTRAVENOUS
  Filled 2013-10-13 (×7): qty 50

## 2013-10-13 MED ORDER — LANOLIN HYDROUS EX OINT: TOPICAL_OINTMENT | CUTANEOUS | Status: DC | PRN

## 2013-10-13 MED ORDER — METHYLERGONOVINE MALEATE 0.2 MG/ML IJ SOLN
INTRAMUSCULAR | Status: AC
  Administered 2013-10-13: 0.2 mg via INTRAMUSCULAR
  Filled 2013-10-13: qty 1

## 2013-10-13 MED ORDER — METHYLERGONOVINE MALEATE 0.2 MG/ML IJ SOLN
0.2000 mg | Freq: Once | INTRAMUSCULAR | Status: AC
  Administered 2013-10-13: 0.2 mg via INTRAMUSCULAR

## 2013-10-13 MED ORDER — PRENATAL MULTIVITAMIN CH
1.0000 | ORAL_TABLET | Freq: Every day | ORAL | Status: DC
  Administered 2013-10-13 – 2013-10-14 (×2): 1 via ORAL
  Filled 2013-10-13 (×2): qty 1

## 2013-10-13 MED ORDER — WITCH HAZEL-GLYCERIN EX PADS: 1.0000 "application " | MEDICATED_PAD | CUTANEOUS | Status: DC | PRN

## 2013-10-13 MED ORDER — PENICILLIN G POTASSIUM 5000000 UNITS IJ SOLR
4.0000 10*6.[IU] | INTRAVENOUS | Status: DC
  Administered 2013-10-13 – 2013-10-14 (×8): 4 10*6.[IU] via INTRAVENOUS
  Filled 2013-10-13 (×14): qty 4

## 2013-10-13 MED ORDER — SIMETHICONE 80 MG PO CHEW: 80.0000 mg | CHEWABLE_TABLET | ORAL | Status: DC | PRN

## 2013-10-13 MED ORDER — TETANUS-DIPHTH-ACELL PERTUSSIS 5-2.5-18.5 LF-MCG/0.5 IM SUSP
0.5000 mL | Freq: Once | INTRAMUSCULAR | Status: AC
  Administered 2013-10-14: 0.5 mL via INTRAMUSCULAR
  Filled 2013-10-13: qty 0.5

## 2013-10-13 MED ORDER — DIPHENHYDRAMINE HCL 25 MG PO CAPS: 25.0000 mg | ORAL_CAPSULE | Freq: Four times a day (QID) | ORAL | Status: DC | PRN

## 2013-10-13 MED ORDER — OXYCODONE-ACETAMINOPHEN 5-325 MG PO TABS: 1.0000 | ORAL_TABLET | ORAL | Status: DC | PRN

## 2013-10-13 MED ORDER — ONDANSETRON HCL 4 MG PO TABS: 4.0000 mg | ORAL_TABLET | ORAL | Status: DC | PRN

## 2013-10-13 NOTE — Progress Notes (Signed)
Patient ID: Sheila Foster, female   DOB: 01/05/1976, 38 y.o.   MRN: [id number]    At 2345 [name] due to increased vag bldg- cytotec 800mcg PO given. Called again at 0025 for 'oozing' blood still. DBP elevated to 100, but pt with chills/shaking and BP reading questionable due to that. I/O cathed bladder for approx 300cc followed by bimanual exam for a lemon-sized clot and several half dollar sized clots. Lochia seemed to decrease afterwards. Pt feeling better, but still with tachypnea (approx 25 bpm) and requesting O2 as that 'helps with breathing'. Denies SOB, however. Still with chills and shaking. Rectal temp 103.3. No elevated temp or BP during labor. Pulse approx 80-100. Spoke with Dr Emelda FearFerguson: alpha strep suspected. Will get blood cultures x 2 as well as begin PCN G 4 mil units q 4 hrs and Clinda 900mg  q 8 hrs. CBC with diff @ 0500.  Cam HaiSHAW, KIMBERLY CNM 10/13/2013 1:25 AM

## 2013-10-13 NOTE — Progress Notes (Signed)
Post Partum Day 1 Subjective: no complaints, up ad lib, voiding and tolerating PO, small lochia, plans to breastfeed, plans to bottle feed, condoms  Developed fever tmax 104.5 this morning, blood cultures drawn, PCN and clinda started  Objective: Blood pressure 104/65, pulse 78, temperature 98 F (36.7 C), temperature source Oral, resp. rate 18, height 5\' 4"  (1.626 m), weight 85.276 kg (188 lb), last menstrual period 01/26/2013, SpO2 95.00%, unknown if currently breastfeeding.  Physical Exam:  General: alert, cooperative and no distress Lochia:normal flow Chest: CTAB Heart: RRR no m/r/g Abdomen: +BS, soft, nontender,  Uterine Fundus: firm, +tenderness DVT Evaluation: No evidence of DVT seen on physical exam. Extremities:  edema   Recent Labs  10/12/13 0736 10/13/13 0545  HGB 11.5* 10.2*  HCT 34.3* 30.1*    Assessment/Plan: Blood cultures ordered AM CBC Penicillin and clindamycin for presumed endometritis GAS Baby's nurse notified, will notify pediatrician   LOS: 1 day   Fredirick LatheKristy Acosta 10/13/2013, 9:47 AM

## 2013-10-13 NOTE — Anesthesia Postprocedure Evaluation (Signed)
Anesthesia Post Note  Patient: Sheila Foster  Procedure(s) Performed: * No procedures listed *  Anesthesia type: Epidural  Patient location: Mother/Baby  Post pain: Pain level controlled  Post assessment: Post-op Vital signs reviewed  Last Vitals:  Filed Vitals:   10/13/13 0545  BP: 104/65  Pulse: 78  Temp: 36.7 C  Resp: 18    Post vital signs: Reviewed  Level of consciousness:alert  Complications: No apparent anesthesia complications

## 2013-10-14 MED ORDER — AMOXICILLIN-POT CLAVULANATE 875-125 MG PO TABS: 1.0000 | ORAL_TABLET | Freq: Two times a day (BID) | ORAL | Status: DC

## 2013-10-14 MED ORDER — IBUPROFEN 600 MG PO TABS: 600.0000 mg | ORAL_TABLET | Freq: Four times a day (QID) | ORAL | Status: DC

## 2013-10-14 NOTE — Discharge Instructions (Signed)
Antes de que el bebé llegue a casa °(Before Baby Comes Home) °Estas son algunas cosas que usted debe tener antes que su bebé llegue a casa. Pregunte nuevamente si no comprende. Pregunte cuándo debe ver al médico nuevamente. °· Asiento infantil para el auto (exigido por ley). °· Camas separadas. °¨ Si no tiene una cama para el bebé o una cuna, puede hacerla con un cajón de una cómoda, una caja, cartón, cesta para la ropa, etc. °¨ No deje que el bebé duerma en una cama con usted u otra persona. °Insumos para la alimentación infantil: °· 6 a 8 botellas (8 onzas). °· 6 a 8 tetinas. °· Jarra medidora. °· Cuchara medidora. °· Cepillo para limpiar botellas. °· Esterilizador (o use cualquier olla o sartén grande con tapa). °· Leche maternizada que contiene hierro. °· Elementos para hervir y enfriar el agua. °Insumos de lactancia materna. °· Sacaleche. °· Crema para los pezones. °Ropa: °· 24 a 36 pañales y cubrepañales de plástico y una caja de pañales desechables. Usted puede necesitar tanto como 10 a 12 pañales por día. °· 3 camisas (otras prendas dependerá de la época del año y el clima). °· 3 mantillas. °· 3 pijamas o camisón de bebé . °· 3 baberos. °Equipamiento de baño: °· Jabón suave. °· Vaselina. No use aceite o talco para el bebé . °· Toalla de tela suave y paño para lavarlo. °· Pompones de algodón. °· Fuente de baño especial para el bebé. Sólo bañe al bebé con una esponja hasta que el cordón umbilical y la circuncisión hayan curado. °Otros suministros: °· Un termómetro y una pera de goma que serán entregados para que lleve a su casa cuando el bebé salga del hospital. Pregunte al médico cómo debe tomar la temperatura del bebé. °· 1 ó 2 chupetes. °Prepárese para una emergencia:  °· Sepa cómo llegar al hospital y sepa en qué lugar admiten al bebé. °· Reúna todos los números telefónicos de los proveedores cerca del teléfono de la casa y en su celular, si tiene uno. °Prepare a su familia:  °· Hable con sus hijos acerca  del nuevo bebé y pregunte qué piensan al respecto. °· Decida cómo desea manejar las visitas y a los otros miembros de la familia. °· Acepte ofertas de ayuda con el bebé. Necesitará tiempo para adaptarse. °Sepa cuándo debe llamar al médico:  °SOLICITE AYUDA DE INMEDIATO SI:  °· Presenta una temperatura mayor a 100.4° F (38° C). °· Abultamiento de las fontanelas en la cabeza. °· Síntomas de deshidratación como llanto sin lágrimas o ausencia de pañales mojados luego de 6 horas. °· Respiración rápida. °· Disminuye el estado de alerta. °Document Released: 06/11/2008 Document Revised: 06/07/2011 °ExitCare® Patient Information ©2015 [employer]. This information is not intended to replace advice given to you by your health care provider. Make sure you discuss any questions you have with your health care provider.Parto vaginal, Cuidados posteriores  °(Vaginal Delivery, Care After) °Siga estas instrucciones durante las próximas semanas. Estas indicaciones para el alta le proporcionan información general acerca de cómo deberá cuidarse después del parto. El médico también podrá darle instrucciones específicas. El tratamiento ha sido planificado según las prácticas médicas actuales, pero en algunos casos pueden ocurrir problemas. Comuníquese con el médico si tiene algún problema o tiene preguntas al volver a su casa.  °INSTRUCCIONES PARA EL CUIDADO EN EL HOGAR  °· Tome sólo medicamentos de venta libre o recetados, según las indicaciones del médico o del farmacéutico. °· No beba alcohol, especialmente si está   amamantando o toma analgésicos. °· No mastique tabaco ni fume. °· No consuma drogas. °· Continúe con un adecuado cuidado perineal. El buen cuidado perineal incluye: °¨ Higienizarse de adelante hacia atrás. °¨ Mantener la zona perineal limpia. °· No use tampones ni duchas vaginales hasta que su médico la autorice. °· Dúchese, lávese el cabello y tome baños de inmersión según las indicaciones de su médico. °· Utilice un  sostén que le ajuste bien y que brinde buen soporte a sus mamas. °· Consuma alimentos saludables. °· Beba suficiente líquido para mantener la orina clara o de color amarillo pálido. °· Consuma alimentos ricos en fibra como cereales y panes integrales, arroz, frijoles y frutas y verduras frescas todos los días. Estos alimentos pueden ayudarla a prevenir o aliviar el estreñimiento. °· Siga las recomendaciones de su médico relacionadas con la reanudación de actividades como subir escaleras, conducir automóviles, levantar objetos, hacer ejercicios o viajar. °· Hable con su médico acerca de reanudar la actividad sexual. Volver a la actividad sexual depende del riesgo de infección, la velocidad de la curación y la comodidad y su deseo de reanudarla. °· Trate de que alguien la ayude con las actividades del hogar y con el recién nacido al menos durante un par de días después de salir del hospital. °· Descanse todo lo que pueda. Trate de descansar o tomar una siesta mientras el bebé está durmiendo. °· Aumente sus actividades gradualmente. °· Cumpla con todas las visitas de control programadas para después del parto. Es muy importante asistir a todas las citas programadas de seguimiento. En estas citas, su médico va a controlarla para asegurarse de que esté sanando física y emocionalmente. °SOLICITE ATENCIÓN MÉDICA SI:  °· Elimina coágulos grandes por la vagina. Guarde algunos coágulos para mostrarle al médico. °· Tiene una secreción con feo olor que proviene de la vagina. °· Tiene dificultad para orinar. °· Orina con frecuencia. °· Siente dolor al orinar. °· Nota un cambio en sus movimientos intestinales. °· Aumenta el enrojecimiento, el dolor o la hinchazón en la zona de la incisión vaginal (episiotomía) o el desgarro vaginal. °· Tiene pus que drena por la episiotomía o el desgarro vaginal. °· La episiotomía o el desgarro vaginal se abren. °· Sus mamas le duelen, están duras o enrojecidas. °· Sufre un dolor intenso de  cabeza. °· Tiene visión borrosa o ve manchas. °· Se siente triste o deprimida. °· Tiene pensamientos acerca de lastimarse o dañar al recién nacido. °· Tiene preguntas acerca de su cuidado personal, el cuidado del recién nacido o acerca de los medicamentos. °· Se siente mareada o sufre un desmayo. °· Tiene una erupción. °· Tiene náuseas o vómitos. °· Usted amamantó al bebé y no ha tenido su período menstrual dentro de las 12 semanas después de dejar de amamantar. °· No amamanta al bebé y no tuvo su período menstrual en las últimas 12° semanas después del parto. °· Tiene fiebre. °SOLICITE ATENCIÓN MÉDICA DE INMEDIATO SI:  °· Siente dolor persistente. °· Siente dolor en el pecho. °· Le falta el aire. °· Se desmaya. °· Siente dolor en la pierna. °· Siente dolor en el estómago. °· El sangrado vaginal satura dos o más apósitos en 1 hora. °ASEGÚRESE DE QUE:  °· Comprende estas instrucciones. °· Controlará su enfermedad. °· Recibirá ayuda de inmediato si no mejora o si empeora. °Document Released: 03/15/2005 Document Revised: 11/15/2012 °ExitCare® Patient Information ©2015 [employer]. This information is not intended to replace advice given to you by your health care provider. Make   sure you discuss any questions you have with your health care provider. ° °

## 2013-10-14 NOTE — Discharge Summary (Signed)
Obstetric Discharge Summary Reason for Admission: induction of labor  2/2 cholestasis Prenatal Procedures: NST Intrapartum Procedures: spontaneous vaginal delivery and GBS prophylaxis Postpartum Procedures: none Complications-Operative and Postpartum: first degree perineal laceration Hemoglobin  Date Value Ref Range Status  10/13/2013 10.2* 12.0 - 15.0 g/dL Final  1/61/09604/23/2014 45.411.9* 12.2 - 16.2 g/dL Final     HCT  Date Value Ref Range Status  10/13/2013 30.1* 36.0 - 46.0 % Final     HCT, POC  Date Value Ref Range Status  07/19/2012 38.5  37.7 - 47.9 % Final   Hospital Course:  Sheila Foster is a 38 y.o. female G3P2002 with IUP at 3488w0d presenting for IOL for cholestasis. Membranes are intact, with active fetal movement.  [facility] Pt. Was started on cytotec and pitocin and advanced to NSVD without complication. Intrapartum course remained uncomplicated as well as postpartum course. She is now with little pain, eating, ambulating, passing flatus. She is stable and ready for discharge.   Several hours postpartum, the pt was noted to have a fever of 104.5. She was started on penicillin and clinda and effervesced quickly. She remained afebrile for 24 hours on abx.  At time of discharge home, she was stable and without any complaints. Pt was discharged home on augmentin for 7 days.     Delivery Note  Pt with sudden pressure after vomiting and was found to be complete. She pushed with 1-2 ctx and at 11:10 PM a viable female was delivered via Vaginal, Spontaneous Delivery (Presentation: Left Occiput Anterior). APGAR:9 ,9 ; weight: pending.  Placenta status: Intact, Spontaneous. Cord: 3 vessels with the following complications:  Anesthesia: Epidural  Episiotomy: none  Lacerations: 1st degree perineal  Suture Repair: 3.0 vicryl  Est. Blood Loss (mL): 400  Mom to postpartum. Baby to Couplet care / Skin to Skin.  Cam HaiSHAW, KIMBERLY CNM  10/12/2013, 11:34 PM     Physical  Exam:  General: alert, cooperative and no distress Lochia: appropriate Uterine Fundus: firm Incision: N/A DVT Evaluation: No evidence of DVT seen on physical exam. No cords or calf tenderness.  Discharge Diagnoses: Term Pregnancy-delivered and Induction of Labor 2/2 Cholestasis  Discharge Information: Date: 10/14/2013 Activity: unrestricted and pelvic rest Diet: routine Medications: PNV, Ibuprofen and augmentin Condition: stable Instructions: refer to practice specific booklet Discharge to: home Follow-up Information   [facility] Schedule an appointment as soon as possible for a visit in 4 weeks. (For postpartum follow up / contraception. )    Specialty:  Obstetrics and Gynecology   Contact information:   [address] [place] [address] [phone]      Newborn Data: Live born female  Birth Weight: 7 lb 6.3 oz (3354 g) APGAR: 9, 9  Home with mother.  Melancon, Caleb G 10/14/2013, 7:19 AM  I have seen and examined this patient and agree with above documentation in the resident's note.   Rulon AbideKeli Isaul Landi, M.D. [facility] 10/14/2013 9:37 AM

## 2013-10-15 ENCOUNTER — Ambulatory Visit: Payer: Self-pay

## 2013-10-15 NOTE — Progress Notes (Signed)
Post discharge chart review completed.  

## 2013-10-15 NOTE — Discharge Summary (Signed)
Attestation of Attending Supervision of Fellow: Evaluation and management procedures were performed by the Fellow under my supervision and collaboration.  I have reviewed the Fellow's note and chart, and I agree with the management and plan.    

## 2013-10-15 NOTE — Lactation Note (Signed)
This note was copied from the chart of Sheila Edwyna Perfectrica Premo. Lactation Consultation Note  Patient Name: Sheila Foster ZOXWR'UToday's Date: 10/15/2013 Reason for consult: Follow-up assessment Per mom has been bottle feeding due soreness.  With moms permission , LC assessed breast tissue and did not note any breakdown , just tender with exam. Had mom hand express with steady flow , base of the nipple areola has some edema , LC reviewed steps for latching  And recommended - prior to latch breast massage, hand express, pre-pump prior to every feeding 8- 10 strokes until the sore ness improves and breast  Compressions with latch until comfortable. LC reviewed sore nipple and engorgement prevention and tx.#27 Flange given , due to potential swelling with milk coming in. Mother informed of post-discharge support and given phone number to the lactation department, including services for phone call assistance; out-patient appointments; and breastfeeding support group. List of other breastfeeding resources in the community given in the handout. Encouraged mother to call for problems or concerns related to breastfeeding. Both mom and dad receptive to teaching and mentioned they understand plan well .   Maternal Data Has patient been taught Hand Expression?: Yes  Feeding Feeding Type:  (recently fed ) Nipple Type: Slow - flow  LATCH Score/Interventions                      Lactation Tools Discussed/Used Tools: Shells;Pump;Comfort gels;Flanges Flange Size: 27 Shell Type: Inverted Breast pump type: Manual WIC Program: Yes (per mom ) Pump Review: Setup, frequency, and cleaning Initiated by:: MAI  Date initiated:: 10/15/13   Consult Status Consult Status: Complete Date: 10/15/13    Sheila Foster, Sheila Foster 10/15/2013, 9:55 AM

## 2013-10-15 NOTE — H&P (Signed)
Attestation of Attending Supervision of Advanced Practitioner (CNM/NP): Evaluation and management procedures were performed by the Advanced Practitioner under my supervision and collaboration. I have reviewed the Advanced Practitioner's note and chart, and I agree with the management and plan.  Dereon Corkery H. 8:48 AM

## 2013-10-16 ENCOUNTER — Inpatient Hospital Stay (HOSPITAL_COMMUNITY): Payer: Medicaid Other

## 2013-10-16 ENCOUNTER — Encounter (HOSPITAL_COMMUNITY): Payer: Self-pay | Admitting: *Deleted

## 2013-10-16 ENCOUNTER — Inpatient Hospital Stay (HOSPITAL_COMMUNITY)
Admission: AD | Admit: 2013-10-16 | Discharge: 2013-10-16 | Disposition: A | Discharge status: Home / Self Care | Payer: Medicaid Other | Source: Ambulatory Visit | Attending: Obstetrics & Gynecology | Admitting: Obstetrics & Gynecology

## 2013-10-16 DIAGNOSIS — O99893 Other specified diseases and conditions complicating puerperium: Secondary | ICD-10-CM | POA: Diagnosis not present

## 2013-10-16 DIAGNOSIS — O909 Complication of the puerperium, unspecified: Secondary | ICD-10-CM

## 2013-10-16 DIAGNOSIS — R109 Unspecified abdominal pain: Secondary | ICD-10-CM | POA: Diagnosis present

## 2013-10-16 DIAGNOSIS — O9989 Other specified diseases and conditions complicating pregnancy, childbirth and the puerperium: Principal | ICD-10-CM

## 2013-10-16 DIAGNOSIS — O26899 Other specified pregnancy related conditions, unspecified trimester: Secondary | ICD-10-CM

## 2013-10-16 DIAGNOSIS — R52 Pain, unspecified: Secondary | ICD-10-CM

## 2013-10-16 DIAGNOSIS — O9089 Other complications of the puerperium, not elsewhere classified: Secondary | ICD-10-CM

## 2013-10-16 HISTORY — DX: Inflammatory disease of uterus, unspecified: N71.9

## 2013-10-16 LAB — CBC WITH DIFFERENTIAL/PLATELET
BASOS ABS: 0 10*3/uL (ref 0.0–0.1)
BASOS PCT: 0 % (ref 0–1)
EOS PCT: 2 % (ref 0–5)
Eosinophils Absolute: 0.2 10*3/uL (ref 0.0–0.7)
HCT: 29.1 % — ABNORMAL LOW (ref 36.0–46.0)
HEMOGLOBIN: 9.5 g/dL — AB (ref 12.0–15.0)
Lymphocytes Relative: 20 % (ref 12–46)
Lymphs Abs: 2 10*3/uL (ref 0.7–4.0)
MCH: 27.9 pg (ref 26.0–34.0)
MCHC: 32.6 g/dL (ref 30.0–36.0)
MCV: 85.6 fL (ref 78.0–100.0)
Monocytes Absolute: 0.4 10*3/uL (ref 0.1–1.0)
Monocytes Relative: 4 % (ref 3–12)
Neutro Abs: 7.1 10*3/uL (ref 1.7–7.7)
Neutrophils Relative %: 74 % (ref 43–77)
Platelets: 203 10*3/uL (ref 150–400)
RBC: 3.4 MIL/uL — ABNORMAL LOW (ref 3.87–5.11)
RDW: 16.6 % — AB (ref 11.5–15.5)
WBC: 9.7 10*3/uL (ref 4.0–10.5)

## 2013-10-16 LAB — URINALYSIS, ROUTINE W REFLEX MICROSCOPIC
Bilirubin Urine: NEGATIVE
GLUCOSE, UA: NEGATIVE mg/dL
KETONES UR: NEGATIVE mg/dL
NITRITE: NEGATIVE
PROTEIN: NEGATIVE mg/dL
Specific Gravity, Urine: <1.005 — ABNORMAL LOW (ref 1.005–1.030)
UROBILINOGEN UA: 0.2 mg/dL (ref 0.0–1.0)
pH: 6 (ref 5.0–8.0)

## 2013-10-16 LAB — URINE MICROSCOPIC-ADD ON

## 2013-10-16 MED ORDER — OXYCODONE-ACETAMINOPHEN 5-325 MG PO TABS: 1.0000 | ORAL_TABLET | Freq: Four times a day (QID) | ORAL | Status: DC | PRN

## 2013-10-16 MED ORDER — HYDROMORPHONE HCL PF 1 MG/ML IJ SOLN
1.0000 mg | Freq: Once | INTRAMUSCULAR | Status: AC
  Administered 2013-10-16: 1 mg via INTRAMUSCULAR
  Filled 2013-10-16: qty 1

## 2013-10-16 MED ORDER — IOHEXOL 300 MG/ML  SOLN
100.0000 mL | Freq: Once | INTRAMUSCULAR | Status: AC | PRN
  Administered 2013-10-16: 100 mL via INTRAVENOUS

## 2013-10-16 MED ORDER — IOHEXOL 300 MG/ML  SOLN
50.0000 mL | Freq: Once | INTRAMUSCULAR | Status: AC | PRN
  Administered 2013-10-16: 50 mL via ORAL

## 2013-10-16 MED ORDER — OXYCODONE-ACETAMINOPHEN 5-325 MG PO TABS
1.0000 | ORAL_TABLET | Freq: Once | ORAL | Status: AC
Start: 2013-10-16 — End: 2013-10-16
  Administered 2013-10-16: 1 via ORAL
  Filled 2013-10-16: qty 1

## 2013-10-16 NOTE — Discharge Instructions (Signed)

## 2013-10-16 NOTE — MAU Provider Note (Signed)
Attestation of Attending Supervision of Advanced Practitioner (PA/CNM/NP): Evaluation and management procedures were performed by the Advanced Practitioner under my supervision and collaboration.  I have reviewed the Advanced Practitioner's note and chart, and I agree with the management and plan.  Mylee Falin, MD, FACOG Attending Obstetrician & Gynecologist Faculty Practice, [facility]   

## 2013-10-16 NOTE — MAU Note (Signed)
Pt states that pain is zero at this time

## 2013-10-16 NOTE — MAU Provider Note (Signed)
History     CSN: [id number]  Arrival date and time: 10/16/13 1214   First Provider Initiated Contact with Patient 10/16/13 1313      Chief Complaint  Patient presents with  . Abdominal Pain  . Postpartum Complications     HPI Sheila Foster is a 38 y.o. [id number] who is 4 days postpartum. Presents with abdominal pain.  TSVD on 10/12/13. Denies complications with pregnancy or delivery. Was treated with antibiotics for post partum fever while in hospital, still taking oral abx. Denies fever/chills at home.  -RLQ pain that spreads to right groin since this morning. States had this pain while in the hospital but went away after discharged. Has been taken rx ibuprofen for pain with some relief. Pain is aggravated by going from seated to standing position.Denies fever or chills at home. Denies n/v/d/constipation. Last BM was yesterday.  -Reports burning with urination since delivery; states did have a urinary catheter before delivery d/t epidural. Denies urinary frequency or urgency.  -Vaginal bleeding has not decreased since delivery. States is saturating pads hourly with bright red blood. Passed several small clots yesterday while in the shower.   Past Medical History  Diagnosis Date  . Constipation   . Shingles 2010  . Contraception     condoms   . Cholelithiasis affecting pregnancy in third trimester, antepartum   . Endometritis July 2015    Past Surgical History  Procedure Laterality Date  . Cesarean section      x1    Family History  Problem Relation Age of Onset  . Heart disease Father     MI age 36  . Hypertension Father   . Diabetes Father   . Colon cancer Neg Hx   . Breast cancer Neg Hx     History  Substance Use Topics  . Smoking status: Never Smoker   . Smokeless tobacco: Never Used  . Alcohol Use: No    Allergies: No Known Allergies  Prescriptions prior to admission  Medication Sig Dispense Refill  . amoxicillin-clavulanate (AUGMENTIN) 875-125 MG  per tablet Take 1 tablet by mouth 2 (two) times daily.  14 tablet  0  . hydrOXYzine (ATARAX/VISTARIL) 25 MG tablet Take 1 tablet (25 mg total) by mouth every 6 (six) hours as needed for itching.  30 tablet  2  . ibuprofen (ADVIL,MOTRIN) 600 MG tablet Take 1 tablet (600 mg total) by mouth every 6 (six) hours.  30 tablet  0  . Prenatal Vit-Fe Fumarate-FA (PRENATAL MULTIVITAMIN) TABS tablet Take 1 tablet by mouth daily at 12 noon.  30 tablet  10    Review of Systems  Constitutional: Negative.   Respiratory: Positive for cough and shortness of breath. Negative for hemoptysis and sputum production.   Cardiovascular: Negative.   Gastrointestinal: Positive for abdominal pain. Negative for nausea, vomiting, diarrhea and constipation.  Genitourinary: Positive for dysuria. Negative for urgency, frequency and hematuria.       Positive vaginal bleeding. Negative for vaginal discharge.    Physical Exam   Blood pressure 116/81, pulse 89, temperature 98.4 F (36.9 C), temperature source Oral, resp. rate 18, last menstrual period 01/26/2013, unknown if currently breastfeeding.  Physical Exam  Constitutional: She is oriented to person, place, and time. She appears well-developed and well-nourished.  Cardiovascular: Normal rate, regular rhythm and normal heart sounds.   No murmur heard. Respiratory: Effort normal and breath sounds normal. She has no wheezes.  GI: Soft. Bowel sounds are normal. She exhibits no distension and no mass.  There is tenderness in the right lower quadrant. There is no rebound, no guarding, no CVA tenderness and negative Murphy's sign.  Neurological: She is alert and oriented to person, place, and time.  Skin: She is not diaphoretic.    MAU Course  Procedures Results for orders placed during the hospital encounter of 10/16/13 (from the past 24 hour(s))  URINALYSIS, ROUTINE W REFLEX MICROSCOPIC     Status: Abnormal   Collection Time    10/16/13 12:20 PM      Result Value Ref  Range   Color, Urine RED (*) YELLOW   APPearance HAZY (*) CLEAR   Specific Gravity, Urine <1.005 (*) 1.005 - 1.030   pH 6.0  5.0 - 8.0   Glucose, UA NEGATIVE  NEGATIVE mg/dL   Hgb urine dipstick LARGE (*) NEGATIVE   Bilirubin Urine NEGATIVE  NEGATIVE   Ketones, ur NEGATIVE  NEGATIVE mg/dL   Protein, ur NEGATIVE  NEGATIVE mg/dL   Urobilinogen, UA 0.2  0.0 - 1.0 mg/dL   Nitrite NEGATIVE  NEGATIVE   Leukocytes, UA SMALL (*) NEGATIVE  URINE MICROSCOPIC-ADD ON     Status: Abnormal   Collection Time    10/16/13 12:20 PM      Result Value Ref Range   Squamous Epithelial / LPF FEW (*) RARE   WBC, UA 3-6  <3 WBC/hpf   RBC / HPF 21-50  <3 RBC/hpf   Bacteria, UA FEW (*) RARE  CBC WITH DIFFERENTIAL     Status: Abnormal   Collection Time    10/16/13  1:31 PM      Result Value Ref Range   WBC 9.7  4.0 - 10.5 K/uL   RBC 3.40 (*) 3.87 - 5.11 MIL/uL   Hemoglobin 9.5 (*) 12.0 - 15.0 g/dL   HCT 54.0 (*) 98.1 - 19.1 %   MCV 85.6  78.0 - 100.0 fL   MCH 27.9  26.0 - 34.0 pg   MCHC 32.6  30.0 - 36.0 g/dL   RDW 47.8 (*) 29.5 - 62.1 %   Platelets 203  150 - 400 K/uL   Neutrophils Relative % 74  43 - 77 %   Neutro Abs 7.1  1.7 - 7.7 K/uL   Lymphocytes Relative 20  12 - 46 %   Lymphs Abs 2.0  0.7 - 4.0 K/uL   Monocytes Relative 4  3 - 12 %   Monocytes Absolute 0.4  0.1 - 1.0 K/uL   Eosinophils Relative 2  0 - 5 %   Eosinophils Absolute 0.2  0.0 - 0.7 K/uL   Basophils Relative 0  0 - 1 %   Basophils Absolute 0.0  0.0 - 0.1 K/uL   Ct Abdomen Pelvis W Contrast  10/16/2013   CLINICAL DATA:  Vaginal delivery 10/12/2013. Lower abdominal pain. RIGHT lower quadrant pain.  EXAM: CT ABDOMEN AND PELVIS WITH CONTRAST  TECHNIQUE: Multidetector CT imaging of the abdomen and pelvis was performed using the standard protocol following bolus administration of intravenous contrast.  CONTRAST:  OMNIPAQUE IOHEXOL 300 MG/ML  SOLN  COMPARISON:  None.  FINDINGS: Bones:  Normal.  No aggressive osseous lesions.  Lung  Bases: Dependent atelectasis.  Liver:  Normal.  Spleen:  Normal.  Gallbladder:  Distended.  No calcified stones identified.  Common bile duct:  Normal.  Pancreas:  Normal.  Adrenal glands:  Normal.  Kidneys: Normal enhancement. No renal calculi. No hydronephrosis. Mild ectasia of the RIGHT ureter do did extrinsic compression from enlarged uterus in the  anatomic pelvis.  Stomach:  Normal.  Small bowel:  Normal.  Colon: Normal appendix in the RIGHT lower quadrant. No inflammatory changes. Moderate stool burden. No inflammatory changes of colon or obstruction.  Pelvic Genitourinary: The urinary bladder is distended. Postpartum uterus is enlarged. The uterus measures 20 cm by 10 cm by 12 cm and has heterogeneous central attenuation. There is no gas within the uterus.  Vasculature: Normal.  Body Wall: Normal.  IMPRESSION: 1. Postpartum uterus, measuring 20 cm x 10 cm x 12 cm. No gas to suggest endometritis. 2. Normal appendix in the RIGHT lower quadrant. 3. No acute abnormality.   Electronically Signed   By: Andreas Newport M.D.   On: 10/16/2013 16:30     MDM Pt given percocet in MAU - states pain is worse after med D/w Dr. Macon Large - CT ordered IM dilaudid given - patient states improvement in pain Assessment and Plan  A: 1. Postpartum pain    P: Discharge home Discussed reasons to return to MAU Keep scheduled post partum visit with [facility] Rx for percocet  Claudie Revering 10/16/2013, 4:53 PM   I have seen and evaluated the patient with the NP/PA/Med student. I agree with the assessment and plan as written above.   Freddi Starr, PA-C  10/16/2013 5:02 PM

## 2013-10-16 NOTE — MAU Note (Addendum)
Vaginal delivery 7/17. C/O pain in lower abdomen. States she had pain at discharge, but now it hurts to move and can't hardly turn over at night. States she has pain in her mouth and R jaw. States she has sores in her mouth. States that R side of her head hurts. C/O swelling in her feet and legs. Describes a normal amount of bleeding. Patient had infection and fever postpartum, treated while still in hospital. Also mentions a "cyst" that she had during the pregnancy that "was not good."

## 2013-10-18 ENCOUNTER — Inpatient Hospital Stay (HOSPITAL_COMMUNITY)
Admission: AD | Admit: 2013-10-18 | Discharge: 2013-10-18 | Disposition: A | Discharge status: Home / Self Care | Payer: Medicaid Other | Source: Ambulatory Visit | Attending: Obstetrics & Gynecology | Admitting: Obstetrics & Gynecology

## 2013-10-18 ENCOUNTER — Encounter (HOSPITAL_COMMUNITY): Payer: Self-pay | Admitting: General Practice

## 2013-10-18 DIAGNOSIS — O99893 Other specified diseases and conditions complicating puerperium: Secondary | ICD-10-CM | POA: Insufficient documentation

## 2013-10-18 DIAGNOSIS — R109 Unspecified abdominal pain: Secondary | ICD-10-CM | POA: Insufficient documentation

## 2013-10-18 DIAGNOSIS — K5900 Constipation, unspecified: Secondary | ICD-10-CM | POA: Insufficient documentation

## 2013-10-18 DIAGNOSIS — O9989 Other specified diseases and conditions complicating pregnancy, childbirth and the puerperium: Principal | ICD-10-CM

## 2013-10-18 LAB — URINE MICROSCOPIC-ADD ON

## 2013-10-18 LAB — URINALYSIS, ROUTINE W REFLEX MICROSCOPIC
BILIRUBIN URINE: NEGATIVE
Glucose, UA: NEGATIVE mg/dL
Ketones, ur: NEGATIVE mg/dL
Leukocytes, UA: NEGATIVE
NITRITE: NEGATIVE
Protein, ur: NEGATIVE mg/dL
Specific Gravity, Urine: 1.02 (ref 1.005–1.030)
UROBILINOGEN UA: 0.2 mg/dL (ref 0.0–1.0)
pH: 6 (ref 5.0–8.0)

## 2013-10-18 LAB — CBC
HCT: 29.4 % — ABNORMAL LOW (ref 36.0–46.0)
HEMOGLOBIN: 9.7 g/dL — AB (ref 12.0–15.0)
MCH: 28.5 pg (ref 26.0–34.0)
MCHC: 33 g/dL (ref 30.0–36.0)
MCV: 86.5 fL (ref 78.0–100.0)
Platelets: 253 10*3/uL (ref 150–400)
RBC: 3.4 MIL/uL — ABNORMAL LOW (ref 3.87–5.11)
RDW: 16.7 % — ABNORMAL HIGH (ref 11.5–15.5)
WBC: 10 10*3/uL (ref 4.0–10.5)

## 2013-10-18 MED ORDER — FERROUS SULFATE 325 (65 FE) MG PO TABS: 325.0000 mg | ORAL_TABLET | Freq: Every day | ORAL | Status: AC

## 2013-10-18 NOTE — MAU Note (Signed)
Abdominal pain postpartum, had PP endometritis treated prior to D/C. Then was seen in MAU several days ago with negative findings. Presents today with C/O continued pain.

## 2013-10-18 NOTE — MAU Note (Signed)
Pt presents to MAU with c/o abdominal pain that started this morning when she woke up. She used the bathroom and passed a moderate to large clot in the toilet and  Then she started to have abdominal pain mostly on the right side. She states the pain was severe, around an 8 on the pain scale and she took 2 oxycodone 5-325. She states she has constant pain at a 6 but then occasionally she feels a sharper pain every 10 minutes. She was here two days ago for abdominal pain also and today states the pain is not as severe.

## 2013-10-18 NOTE — Discharge Instructions (Signed)
Cuidados luego de un parto por vía vaginal °(Postpartum Care After Vaginal Delivery) °Luego del nacimiento del bebé deberá permanecer en el hospital durante 24 a 72 horas, excepto que hubiera existido algún problema, o usted sufra alguna enfermedad. Mientras se encuentre en el hospital recibirá ayuda e instrucciones por parte de las enfermeras y el médico, quienes cuidarán de usted y su bebé y le darán consejos para amamantarlo correctamente, especialmente si es el primer hijo.  °En caso de ser necesario, le prescribirán analgésicos. Observará una pequeña hemorragia vaginal y deberá cambiar los apósitos con frecuencia. Lávese las manos cuidadosamente con agua y jabón durante al menos 20 segundos luego de cambiarse el apósito o ir al baño. Si elimina coágulos o aumenta la hemorragia, infórmelo a la enfermera. No deseche los coágulos sanguíneos antes de mostrárselos a la enfermera, para asegurarse de que no es tejido placentario. °Si le han colocado una vía intravenosa, se la retirarán dentro de las 24 horas, si no hay problemas. La primera vez que se levante de la cama o tome una ducha, llame a la enfermera para que la ayude que puede sentirse débil, mareada o desmayarse. Si está amamantando, puede sentir contracciones dolorosas en el útero durante algunas semanas. Esto es normal y necesario, ya que de este modo el útero vuelve a su tamaño normal. Si no está amamantando, utilice un sostén de soporte y trate de no tocarse las mamas hasta que haya dejado de producir leche. No deben administrarse hormonas para suprimir la leche, debido a que pueden causar coágulos sanguíneos. Podrá seguir una dieta normal, excepto que sufra diabetes o presente otros problemas de salud.  °La enfermera colocará bolsas con hielo en el sitio de la episiotomía (agrandamiento quirúrgico de la apertura vaginal) para reducir el dolor y la hinchazón. En algunos casos raros hay dificultad para orinar, entonces la enfermera deberá vaciarle la  vejiga con un catéter. Si le han practicado una ligadura tubaria durante el posparto ("trompas atadas", esterilización femenina), esto no hará que permanezca más tiempo en el hospital. °Podrá tener al bebé en su habitación todo el tiempo que lo desee si el bebé no tiene ningún problema. Lleve y traiga al bebé de la nursery dentro de la cunita. No lo lleve en brazos. No abandone el área de posparto. Si la madre es Rh negativa (falta de una proteína en los glóbulos rojos) y el bebé es Rh positivo, la madre debe aplicarse la vacuna RhoGam para evitar problemas con el factor Rh en futuros embarazos °Le darán instrucciones por escrito para usted y el bebé y los medicamentos necesarios cuando reciba el alta médica. Asegúrese que comprende y sigue las indicaciones. °INSTRUCCIONES PARA EL CUIDADO DOMICILIARIO °· Siga las instrucciones y tome los medicamentos que le indicaron cuando le dieron el alta médica.  °· Utilice los medicamentos de venta libre o de prescripción para el dolor, el malestar o la fiebre, según se lo indique el profesional que lo asiste.  °· No tome aspirina, ya que puede causar hemorragias.  °· Aumente sus actividades un poco cada día para tener más fuerza y resistencia.  °· No beba alcohol, especialmente si está amamantando o toma analgésicos.  °· Tómese la temperatura dos veces por día y regístrela.  °· Podrá tener una pequeña hemorragia durante 2 a 4 semanas. Esto es normal.  °· No utilice tampones o duchas vaginales, use toallas higiénicas.  °· Trate de que alguna persona permanezca con usted y la ayude durante los primeros días en el hogar.  °·   Descanse o duerma una siesta cuando el bebé duerma.  °· Si está amamantando, use un buen sostén. Si no está amamantando, use un buen sostén y no estimule los pezones.  °· Consuma una dieta sana y siga tomando las vitaminas prenatales.  °· No conduzca vehículos, no realice actividades pesadas ni viaje hasta que su médico la autorice.  °· No mantenga relaciones  sexuales hasta que el médico lo permita.  °· Consulte con el profesional cuando puede comenzar a realizar actividad física y que tipo de ejercicios puede hacer.  °· Comuníquese inmediatamente con el médico si tiene problemas luego del parto.  °· Comuníquese con el pediatra si tiene problemas con el bebé.  °· Programe su visita de control luego del parto y cúmplala.  °SOLICITE ATENCIÓN MÉDICA SI: °· La temperatura se eleva por encima de 100° F (37.8° C).  °· Aumenta la hemorragia vaginal o elimina coágulos. Conserve algunos coágulos para mostrárselos al médico.  °· Observa sangre o siente dolor al orinar.  °· Presenta secreción vaginal con olor fétido.  °· Aumenta el dolor o la inflamación en el sitio de la episiotomía (agrandamiento quirúrgico de la apertura vaginal).  °· Sufre una cefalea grave.  °· Se siente deprimida.  °· La incisión se abre.  °· Se siente mareada o sufre un desmayo.  °· Aparece una erupción cutánea.  °· Tiene una reacción o problemas con su medicamento.  °· Siente dolor u observa enrojecimiento e hinchazón en el sitio de la vía intravenosa.  °SOLICITE ATENCIÓN MÉDICA DE INMEDIATO SI: °· Siente dolor en el pecho.  °· Comienza a sentir falta de aire.  °· Se desmaya.  °· Siente dolor, con o sin hinchazón e irritación en la pierna.  °· Tiene una hemorragia vaginal abundante, con o sin coágulos  °· Siente dolor en el estómago.  °· Observa una secreción vaginal con mal olor.  °ASEGURESE QUE:  °· Comprende estas instrucciones.  °· Controlará su enfermedad.  °· Solicitará ayuda de inmediato si no mejora o empeora.  °Document Released: 01/10/2007 Document Revised: 03/04/2011 °Exit[employer]® Patient Information ©2012 [employer]. °

## 2013-10-18 NOTE — MAU Provider Note (Signed)
Attestation of Attending Supervision of Advanced Practitioner (PA/CNM/NP): Evaluation and management procedures were performed by the Advanced Practitioner under my supervision and collaboration.  I have reviewed the Advanced Practitioner's note and chart, and I agree with the management and plan.  Aylee Littrell, MD, FACOG Attending Obstetrician & Gynecologist Faculty Practice, [facility]   

## 2013-10-18 NOTE — MAU Provider Note (Signed)
History     CSN: [id number]  Arrival date and time: 10/18/13 [id number]   First Provider Initiated Contact with Patient 10/18/13 1001      Chief Complaint  Patient presents with  . Abdominal Pain  . Vaginal Bleeding   HPI  Pt is here s/p NSVD on 10/12/13.  Received PO antibiotics for suspected endometritis due to a Tmax of 104.5.  Seen on 7/21 for severe abd pain.  CT scan negative.  Pt presents to MAU with c/o abdominal pain that started this morning when she woke up. She used the bathroom and passed a moderate to large clot in the toilet.  She states the pain was severe, around an 8 on the pain scale and she took 2 oxycodone 5-325. Since passing the clot and taking the medication patient reports pain has ceased.    Past Medical History  Diagnosis Date  . Constipation   . Shingles 2010  . Contraception     condoms   . Cholelithiasis affecting pregnancy in third trimester, antepartum   . Endometritis July 2015    Past Surgical History  Procedure Laterality Date  . Cesarean section      x1    Family History  Problem Relation Age of Onset  . Heart disease Father     MI age 36  . Hypertension Father   . Diabetes Father   . Colon cancer Neg Hx   . Breast cancer Neg Hx     History  Substance Use Topics  . Smoking status: Never Smoker   . Smokeless tobacco: Never Used  . Alcohol Use: No    Allergies: No Known Allergies  Prescriptions prior to admission  Medication Sig Dispense Refill  . amoxicillin-clavulanate (AUGMENTIN) 875-125 MG per tablet Take 1 tablet by mouth 2 (two) times daily.  14 tablet  0  . calcium carbonate (TUMS - DOSED IN MG ELEMENTAL CALCIUM) 500 MG chewable tablet Chew 1 tablet by mouth daily as needed for indigestion or heartburn.      . hydrOXYzine (ATARAX/VISTARIL) 25 MG tablet Take 1 tablet (25 mg total) by mouth every 6 (six) hours as needed for itching.  30 tablet  2  . ibuprofen (ADVIL,MOTRIN) 600 MG tablet Take 1 tablet (600 mg total) by mouth  every 6 (six) hours.  30 tablet  0  . oxyCODONE-acetaminophen (ROXICET) 5-325 MG per tablet Take 1-2 tablets by mouth every 6 (six) hours as needed for severe pain.  15 tablet  0  . Prenatal Vit-Fe Fumarate-FA (PRENATAL MULTIVITAMIN) TABS tablet Take 1 tablet by mouth daily at 12 noon.  30 tablet  10    Review of Systems  Gastrointestinal: Positive for abdominal pain (cramping).  Genitourinary: Negative.        Vaginal blood clot   Physical Exam   Blood pressure 113/69, pulse 76, temperature 98.5 F (36.9 C), temperature source Oral, resp. rate 18, height 5\' 4"  (1.626 m), weight 80.74 kg (178 lb), unknown if currently breastfeeding.  Physical Exam  Constitutional: She is oriented to person, place, and time. She appears well-developed and well-nourished. No distress.  HENT:  Head: Normocephalic.  Neck: Normal range of motion. Neck supple.  Cardiovascular: Normal rate, regular rhythm and normal heart sounds.   Respiratory: Effort normal and breath sounds normal.  GI: Soft. There is no tenderness.  Genitourinary: There is bleeding (scant; dark red; no bright red or brisk bleed) around the vagina. Vaginal discharge: vaginal bleeding.  Musculoskeletal: Normal range of motion.  Neurological:  She is alert and oriented to person, place, and time. She has normal reflexes.  Skin: Skin is warm and dry.   Plum-sized clot visualized in jar (brought in by patient)  MAU Course  Procedures  Results for orders placed during the hospital encounter of 10/18/13 (from the past 24 hour(s))  URINALYSIS, ROUTINE W REFLEX MICROSCOPIC     Status: Abnormal   Collection Time    10/18/13  8:54 AM      Result Value Ref Range   Color, Urine YELLOW  YELLOW   APPearance CLEAR  CLEAR   Specific Gravity, Urine 1.020  1.005 - 1.030   pH 6.0  5.0 - 8.0   Glucose, UA NEGATIVE  NEGATIVE mg/dL   Hgb urine dipstick LARGE (*) NEGATIVE   Bilirubin Urine NEGATIVE  NEGATIVE   Ketones, ur NEGATIVE  NEGATIVE mg/dL    Protein, ur NEGATIVE  NEGATIVE mg/dL   Urobilinogen, UA 0.2  0.0 - 1.0 mg/dL   Nitrite NEGATIVE  NEGATIVE   Leukocytes, UA NEGATIVE  NEGATIVE  URINE MICROSCOPIC-ADD ON     Status: Abnormal   Collection Time    10/18/13  8:54 AM      Result Value Ref Range   Squamous Epithelial / LPF FEW (*) RARE   RBC / HPF 21-50  <3 RBC/hpf   Bacteria, UA RARE  RARE  CBC     Status: Abnormal   Collection Time    10/18/13 10:13 AM      Result Value Ref Range   WBC 10.0  4.0 - 10.5 K/uL   RBC 3.40 (*) 3.87 - 5.11 MIL/uL   Hemoglobin 9.7 (*) 12.0 - 15.0 g/dL   HCT 96.0 (*) 45.4 - 09.8 %   MCV 86.5  78.0 - 100.0 fL   MCH 28.5  26.0 - 34.0 pg   MCHC 33.0  30.0 - 36.0 g/dL   RDW 11.9 (*) 14.7 - 82.9 %   Platelets 253  150 - 400 K/uL   Hgb increased since 10/16/13  Assessment and Plan  Normal Postpartum Vaginal Bleeding  Plan: Discharge the patient to home Provided reassurance Explained what to expect in the postpartum period RX Ferrous Sulfate 325mg  qday - may expect constipation  Elenora Fender [facility] 10/18/2013, 10:06 AM

## 2013-10-19 LAB — CULTURE, BLOOD (ROUTINE X 2)
CULTURE: NO GROWTH
Culture: NO GROWTH

## 2013-10-23 ENCOUNTER — Ambulatory Visit (INDEPENDENT_AMBULATORY_CARE_PROVIDER_SITE_OTHER): Payer: Medicaid Other

## 2013-10-23 ENCOUNTER — Telehealth: Payer: Self-pay | Admitting: *Deleted

## 2013-10-23 DIAGNOSIS — R300 Dysuria: Secondary | ICD-10-CM

## 2013-10-23 LAB — POCT URINALYSIS DIP (DEVICE)
BILIRUBIN URINE: NEGATIVE
GLUCOSE, UA: NEGATIVE mg/dL
Ketones, ur: NEGATIVE mg/dL
Nitrite: NEGATIVE
Protein, ur: NEGATIVE mg/dL
Specific Gravity, Urine: 1.025 (ref 1.005–1.030)
Urobilinogen, UA: 0.2 mg/dL (ref 0.0–1.0)
pH: 5.5 (ref 5.0–8.0)

## 2013-10-23 NOTE — Telephone Encounter (Signed)
Pt left message stating that she would like a refill of the antibiotic amoxicillin.  Review of chart reveals that pt was d/c from hospital on 7/19 and is being treated for endometritis and PP fever.  I returned pt's call and she asked her friend to interpret for her. All conversation with the pt was via the friend. The patient stated that she wants the refill because she still has pain in her abdomen when she gets up and tries to do things around the house. She said the pain is not as bad as when she was at the hospital and she denied having fever. She also reports painful urination x2 days. I advised pt that she does not need antibiotic refill for the endometritis and that she is going to be sore for awhile. She needs to rest and just care for her baby. She may have a bladder infection. I asked if pt could come to the hospital and submit a urine specimen. She voiced understanding of all information given and agreed and will come in today by 4pm.

## 2013-10-23 NOTE — Progress Notes (Signed)
Patient here today for CCUA due to c/o of burning/painful urination. UA shows leukocytes, blood and some protein. Informed patient that we will send urine for culture so that we can treat infection with the appropriate medication. Informed patient she will be called within the next day or two with results but that she may call clinic if she does not hear from us by Thursday. Patient verbalized understanding and gratitude. No questions or concerns.

## 2013-10-26 LAB — URINE CULTURE

## 2013-10-29 ENCOUNTER — Telehealth: Payer: Self-pay | Admitting: *Deleted

## 2013-10-29 DIAGNOSIS — R8279 Other abnormal findings on microbiological examination of urine: Secondary | ICD-10-CM

## 2013-10-29 MED ORDER — SULFAMETHOXAZOLE-TMP DS 800-160 MG PO TABS: 1.0000 | ORAL_TABLET | Freq: Two times a day (BID) | ORAL | Status: DC

## 2013-10-29 NOTE — Telephone Encounter (Signed)
Message copied by Dorothyann PengHAIZLIP, Chord Takahashi E on Mon Oct 29, 2013 11:18 AM ------      Message from: Danae OrleansPOE, DEIRDRE C      Created: Sat Oct 27, 2013  2:36 PM       Rx BActrim DS 1 bid x 7d ------

## 2013-10-29 NOTE — Telephone Encounter (Signed)
Contacted patient, results given.  Medication ordered and patient advised to go pick up a her pharmacy.  Pharmacy verified.  Pt verbalizes understanding.

## 2013-11-01 ENCOUNTER — Other Ambulatory Visit: Payer: Self-pay

## 2013-11-01 DIAGNOSIS — R8279 Other abnormal findings on microbiological examination of urine: Secondary | ICD-10-CM

## 2013-11-01 MED ORDER — SULFAMETHOXAZOLE-TMP DS 800-160 MG PO TABS: 1.0000 | ORAL_TABLET | Freq: Two times a day (BID) | ORAL | Status: AC

## 2013-11-22 ENCOUNTER — Ambulatory Visit (INDEPENDENT_AMBULATORY_CARE_PROVIDER_SITE_OTHER): Payer: Medicaid Other | Admitting: Obstetrics & Gynecology

## 2013-11-22 DIAGNOSIS — Z3009 Encounter for other general counseling and advice on contraception: Secondary | ICD-10-CM

## 2013-11-22 LAB — POCT PREGNANCY, URINE: Preg Test, Ur: NEGATIVE

## 2013-11-22 NOTE — Progress Notes (Signed)
Patient ID: Sheila Foster, female   DOB: 1975/05/23, 38 y.o.   MRN: [id number] M. Tedd Sias used for interpreter today.  Pt reports cramping in LLQ around previous cesarean section.

## 2013-11-22 NOTE — Patient Instructions (Signed)
Regrese a la clinica cuando tenga su cita. Si tiene problemas o preguntas, llama a la clinica o vaya a la [facility] al [facility].    

## 2013-11-22 NOTE — Progress Notes (Signed)
    Subjective:     Sheila Foster is a 38 y.o. G38P3003 female who presents for a postpartum visit. She is s/p VBAC on 10/12/2013 at 37 weeks. Prenatal course complicated by cholestasis and AMA. I have fully reviewed the prenatal and intrapartum course. Anesthesia: epidural. Postpartum course has been complicated by endometritis.  Baby's course has been uncomplicted. Baby is feeding by both breast and bottle. Bleeding: no bleeding. Bowel function is normal. Bladder function is normal. Patient is sexually active. Contraception method is condoms. Postpartum depression screening: negative.  The following portions of the patient's history were reviewed and updated as appropriate: allergies, current medications, past family history, past medical history, past social history, past surgical history and problem list.  Normal pap and negative HRHPV in 06/12/13.  Review of Systems Pertinent items are noted in HPI.   Objective:    BP 97/54  Pulse 78  Temp(Src) 98.1 F (36.7 C)  Wt 172 lb 6.4 oz (78.2 kg)  Breastfeeding? Yes  General:  alert and no distress   Breasts:  inspection negative, no nipple discharge or bleeding, no masses or nodularity palpable  Lungs: clear to auscultation bilaterally  Heart:  regular rate and rhythm  Abdomen: soft, non-tender; bowel sounds normal; no masses,  no organomegaly  Pelvic:  deferred  Rectal Exam: Not performed.        Assessment:   Normal postpartum exam. Pap smear not done at today's visit.   Plan:   1. Contraception: condoms 2. Follow up as needed.  Jaynie Collins, MD, FACOG Attending Obstetrician & Gynecologist Faculty [facility], [facility]

## 2014-01-25 ENCOUNTER — Telehealth: Payer: Self-pay | Admitting: *Deleted

## 2014-01-25 NOTE — Telephone Encounter (Signed)
Patient called clinic, left a message for a return call from a nurse.  Contacted patient, pt describes having heavy period for past 2 months. Bleeding with clots for 11 days.  Discussed with Dr. Debroah LoopArnold who recommends patient take NSAIDS and make an appointment with provider. Pt verbalizes understanding.

## 2014-01-28 ENCOUNTER — Encounter (HOSPITAL_COMMUNITY): Payer: Self-pay | Admitting: General Practice

## 2014-02-11 ENCOUNTER — Telehealth: Payer: Self-pay | Admitting: Obstetrics and Gynecology

## 2014-02-11 ENCOUNTER — Ambulatory Visit: Payer: Medicaid Other | Admitting: Family Medicine

## 2014-02-11 NOTE — Telephone Encounter (Signed)
Called left message for patient informing her that we needed to reschedule appointment. Provided new appointment time/date.

## 2014-02-18 ENCOUNTER — Ambulatory Visit (INDEPENDENT_AMBULATORY_CARE_PROVIDER_SITE_OTHER): Payer: Self-pay | Admitting: Obstetrics and Gynecology

## 2014-02-18 ENCOUNTER — Encounter: Payer: Self-pay | Admitting: Obstetrics and Gynecology

## 2014-02-18 VITALS — BP 102/62 | HR 84 | Wt 174.9 lb

## 2014-02-18 DIAGNOSIS — N939 Abnormal uterine and vaginal bleeding, unspecified: Secondary | ICD-10-CM

## 2014-02-18 NOTE — Progress Notes (Signed)
Patient ID: Sheila Foster, female   DOB: 12/29/1975, 38 y.o.   MRN: [id number] 38 yo G3P3 presenting today for evaluation of abnormal uterine bleeding. Patient reports onset of abnormal bleeding following her most recent vaginal delivery in July 2015. Patient is formula feeding. She reports a heavy period in September, and October. She states they are heavy, lasting 8-10 days with passage of clots. She has a history of heavy menses but these past few seems to be worst. They are accompanied by dysmenorrhea which is well controlled with ibuprofen. She denies chest pain, SOB, lightheadedness or dizziness.  Past Medical History  Diagnosis Date  . Constipation   . Shingles 2010  . Contraception     condoms   . Cholelithiasis affecting pregnancy in third trimester, antepartum   . Endometritis July 2015   Past Surgical History  Procedure Laterality Date  . Cesarean section      x1   Family History  Problem Relation Age of Onset  . Heart disease Father     MI age 860  . Hypertension Father   . Diabetes Father   . Colon cancer Neg Hx   . Breast cancer Neg Hx    History  Substance Use Topics  . Smoking status: Never Smoker   . Smokeless tobacco: Never Used  . Alcohol Use: No   GENERAL: Well-developed, well-nourished female in no acute distress.  Declined pelvic exam as she started her menses  A/P 38 yo with menorrhagia - will obtain pelvic ultrasound - Discussed management with contraception- patient opted to try depo-provera. - Will contact patient with ultrasound results and manage accordingly - RTC in 6 months for follow up

## 2014-02-25 ENCOUNTER — Ambulatory Visit (HOSPITAL_COMMUNITY): Payer: Self-pay

## 2014-02-27 ENCOUNTER — Ambulatory Visit: Payer: Medicaid Other | Admitting: Obstetrics and Gynecology

## 2014-03-08 ENCOUNTER — Ambulatory Visit: Payer: Self-pay | Admitting: Family Medicine

## 2014-03-08 ENCOUNTER — Ambulatory Visit: Payer: Self-pay | Admitting: Advanced Practice Midwife

## 2014-03-27 ENCOUNTER — Ambulatory Visit: Payer: Medicaid Other | Admitting: Obstetrics & Gynecology

## 2014-04-08 ENCOUNTER — Encounter: Payer: Self-pay | Admitting: Gynecology

## 2014-04-08 ENCOUNTER — Ambulatory Visit (INDEPENDENT_AMBULATORY_CARE_PROVIDER_SITE_OTHER): Payer: BLUE CROSS/BLUE SHIELD | Admitting: Gynecology

## 2014-04-08 VITALS — BP 118/76

## 2014-04-08 DIAGNOSIS — N938 Other specified abnormal uterine and vaginal bleeding: Secondary | ICD-10-CM

## 2014-04-08 DIAGNOSIS — R7989 Other specified abnormal findings of blood chemistry: Secondary | ICD-10-CM

## 2014-04-08 DIAGNOSIS — R945 Abnormal results of liver function studies: Secondary | ICD-10-CM

## 2014-04-08 LAB — COMPREHENSIVE METABOLIC PANEL
ALBUMIN: 4.1 g/dL (ref 3.5–5.2)
ALT: 16 U/L (ref 0–35)
AST: 12 U/L (ref 0–37)
Alkaline Phosphatase: 67 U/L (ref 39–117)
BUN: 11 mg/dL (ref 6–23)
CHLORIDE: 104 meq/L (ref 96–112)
CO2: 27 mEq/L (ref 19–32)
Calcium: 9.1 mg/dL (ref 8.4–10.5)
Creat: 0.62 mg/dL (ref 0.50–1.10)
Glucose, Bld: 106 mg/dL — ABNORMAL HIGH (ref 70–99)
Potassium: 4 mEq/L (ref 3.5–5.3)
SODIUM: 137 meq/L (ref 135–145)
Total Bilirubin: 0.2 mg/dL (ref 0.2–1.2)
Total Protein: 6.9 g/dL (ref 6.0–8.3)

## 2014-04-08 LAB — CBC WITH DIFFERENTIAL/PLATELET
BASOS ABS: 0 10*3/uL (ref 0.0–0.1)
Basophils Relative: 0 % (ref 0–1)
EOS ABS: 0.1 10*3/uL (ref 0.0–0.7)
EOS PCT: 1 % (ref 0–5)
HCT: 22.1 % — ABNORMAL LOW (ref 36.0–46.0)
Hemoglobin: 6.5 g/dL — CL (ref 12.0–15.0)
LYMPHS PCT: 22 % (ref 12–46)
Lymphs Abs: 2 10*3/uL (ref 0.7–4.0)
MCH: 19.9 pg — ABNORMAL LOW (ref 26.0–34.0)
MCHC: 29.4 g/dL — ABNORMAL LOW (ref 30.0–36.0)
MCV: 67.6 fL — ABNORMAL LOW (ref 78.0–100.0)
MPV: 10.8 fL (ref 8.6–12.4)
Monocytes Absolute: 0.5 10*3/uL (ref 0.1–1.0)
Monocytes Relative: 6 % (ref 3–12)
NEUTROS PCT: 71 % (ref 43–77)
Neutro Abs: 6.5 10*3/uL (ref 1.7–7.7)
Platelets: 367 10*3/uL (ref 150–400)
RBC: 3.27 MIL/uL — ABNORMAL LOW (ref 3.87–5.11)
RDW: 18.3 % — AB (ref 11.5–15.5)
WBC: 9.1 10*3/uL (ref 4.0–10.5)

## 2014-04-08 LAB — PREGNANCY, URINE: Preg Test, Ur: NEGATIVE

## 2014-04-08 MED ORDER — DOXYCYCLINE HYCLATE 100 MG PO CAPS: 100.0000 mg | ORAL_CAPSULE | Freq: Two times a day (BID) | ORAL | Status: DC

## 2014-04-08 MED ORDER — MEGESTROL ACETATE 40 MG PO TABS: 40.0000 mg | ORAL_TABLET | Freq: Two times a day (BID) | ORAL | Status: DC

## 2014-04-08 NOTE — Progress Notes (Signed)
   Patient is a 39 year old gravida 3 para 3 (first pregnancy resulting normal spontaneous vaginal delivery, second pregnancy resulting in cesarean section, third pregnancy July of this year vaginal birth after C-section) who presented to the office today with dysfunctional uterine bleeding since birth of her child. She stated that she breast-fed for one month. Due to her bleeding she is very infrequent weighing having any intercourse. Patient had elevated liver function tests during her pregnancy but has had no follow-up. She did have her six-week postpartum visit. The only result I have on record is her hemoglobin was 9.7. She was supposed to be taking iron supplementation. Patient denies any visual disturbances any unusual headache or any nipple discharge.  Exam: Abdomen: Soft nontender no rebound or guarding Pelvic: Bartholin urethra Skene was within normal limits Vagina: Menstrual blood present Cervix menstrual blood present Bimanual exam uterus anteverted normal size shape and consistency Adnexa: No palpable masses or tenderness Rectal exam not done  Urine print see test negative  Assessment/plan: Patient with persistent dysfunction bleeding since delivery of her child back in July 2015. Patient had been scheduled for a follow-up ultrasound as a result of her dysfunctional uterine bleeding by her obstetrician but due to not having insurance did not have the ultrasound done. Since her urine pregnancy test was negative today she will be placed on Megace 40 mg one by mouth twice a day for 10 days and return to the office next week for sonohysterogram for better visualization of her intrauterine cavity. She is also been a be placed on Vibramycin 100 mg twice a day for 7 days in the event of an underlying endometritis. Patient was counseled and underwent an endometrial biopsy for which tissue was obtained and submitted for histological evaluation. All the above were discussed with the patient Spanish  and we'll follow up according. We will check today her CBC as well as a comprehensive metabolic panel due to her past history of elevated liver function tests.

## 2014-04-09 ENCOUNTER — Telehealth: Payer: Self-pay | Admitting: Gynecology

## 2014-04-09 NOTE — Telephone Encounter (Signed)
Patient was seen yesterday in the office physical result of her dysfunctional uterine bleeding. She had delivered in July 2015 and had been bleeding on and off since then. Review of her record had indicated that her post partum hemoglobin was 9.7 patient was supposed to be taking her iron tablet. Also records indicated that she had liver function test prior to her delivery so she was here for follow-up. Her delivery was with another group.   See previous note 04/08/2014 asked her examination. She was contacted today by phone  To see how she was doing. Her CBC had demonstrated hemoglobin 6.5 and hematocrit 22.1 platelet count 267,000. Her comprehensive metabolic panel indicated normal liver function tests blood sugar 106. In the office yesterday the patient blood pressures of 118/76.   Patient states that her bleeding has stopped since she was placed on the Megace 40 mg twice a day for 10 days. She was started on Vibramycin 100 mg twice a day for 7 days in the event of a possible endometritis. And she was instructed to take her iron tablet one twice a day. Her endometrial biopsy that was done at yesterday's visit was discussed with her  Which the following was reported:  Endometrium, biopsy - INTERVAL PHASE ENDOMETRIUM (LATE PROLIFERATIVE/EARLY SECRETORY). - THERE IS NO EVIDENCE OF HYPERPLASIA OR MALIGNANCY.   Patient will return back next week for sonohysterogram to better evaluate her intrauterine cavity to rule out any endometrial polyp or submucous myoma. She'll also be prescribed either oral contraceptive pill or the Depo-Provera cc the 2 forms of contraception she is interested in utilizing.

## 2014-04-10 ENCOUNTER — Other Ambulatory Visit: Payer: Self-pay | Admitting: Gynecology

## 2014-04-10 DIAGNOSIS — N938 Other specified abnormal uterine and vaginal bleeding: Secondary | ICD-10-CM

## 2014-04-17 ENCOUNTER — Encounter: Payer: Self-pay | Admitting: Gynecology

## 2014-04-17 ENCOUNTER — Ambulatory Visit (INDEPENDENT_AMBULATORY_CARE_PROVIDER_SITE_OTHER): Payer: BLUE CROSS/BLUE SHIELD | Admitting: Gynecology

## 2014-04-17 ENCOUNTER — Other Ambulatory Visit: Payer: Self-pay | Admitting: Gynecology

## 2014-04-17 ENCOUNTER — Ambulatory Visit (INDEPENDENT_AMBULATORY_CARE_PROVIDER_SITE_OTHER): Payer: BLUE CROSS/BLUE SHIELD

## 2014-04-17 DIAGNOSIS — R9389 Abnormal findings on diagnostic imaging of other specified body structures: Secondary | ICD-10-CM

## 2014-04-17 DIAGNOSIS — N840 Polyp of corpus uteri: Secondary | ICD-10-CM

## 2014-04-17 DIAGNOSIS — N921 Excessive and frequent menstruation with irregular cycle: Secondary | ICD-10-CM

## 2014-04-17 DIAGNOSIS — N838 Other noninflammatory disorders of ovary, fallopian tube and broad ligament: Secondary | ICD-10-CM

## 2014-04-17 DIAGNOSIS — N938 Other specified abnormal uterine and vaginal bleeding: Secondary | ICD-10-CM

## 2014-04-17 DIAGNOSIS — N83201 Unspecified ovarian cyst, right side: Secondary | ICD-10-CM

## 2014-04-17 DIAGNOSIS — R938 Abnormal findings on diagnostic imaging of other specified body structures: Secondary | ICD-10-CM

## 2014-04-17 DIAGNOSIS — N8320 Unspecified ovarian cysts: Secondary | ICD-10-CM

## 2014-04-17 DIAGNOSIS — D500 Iron deficiency anemia secondary to blood loss (chronic): Secondary | ICD-10-CM

## 2014-04-17 LAB — CBC WITH DIFFERENTIAL/PLATELET
Basophils Absolute: 0.1 10*3/uL (ref 0.0–0.1)
Basophils Relative: 1 % (ref 0–1)
EOS PCT: 1 % (ref 0–5)
Eosinophils Absolute: 0.1 10*3/uL (ref 0.0–0.7)
HCT: 25.1 % — ABNORMAL LOW (ref 36.0–46.0)
HEMOGLOBIN: 7.4 g/dL — AB (ref 12.0–15.0)
LYMPHS ABS: 2.7 10*3/uL (ref 0.7–4.0)
LYMPHS PCT: 31 % (ref 12–46)
MCH: 19.4 pg — ABNORMAL LOW (ref 26.0–34.0)
MCHC: 29.5 g/dL — ABNORMAL LOW (ref 30.0–36.0)
MCV: 65.7 fL — ABNORMAL LOW (ref 78.0–100.0)
MONOS PCT: 6 % (ref 3–12)
Monocytes Absolute: 0.5 10*3/uL (ref 0.1–1.0)
NEUTROS PCT: 61 % (ref 43–77)
Neutro Abs: 5.4 10*3/uL (ref 1.7–7.7)
Platelets: 354 10*3/uL (ref 150–400)
RBC: 3.82 MIL/uL — AB (ref 3.87–5.11)
RDW: 21 % — ABNORMAL HIGH (ref 11.5–15.5)
WBC: 8.8 10*3/uL (ref 4.0–10.5)

## 2014-04-17 MED ORDER — MEGESTROL ACETATE 40 MG PO TABS: 40.0000 mg | ORAL_TABLET | Freq: Two times a day (BID) | ORAL | Status: DC

## 2014-04-17 NOTE — Addendum Note (Signed)
Addended by: Ok EdwardsFERNANDEZ, Cherre Kothari H on: 04/17/2014 04:35 PM   Modules accepted: Orders

## 2014-04-17 NOTE — Patient Instructions (Signed)
Ligadura de trompas laparoscpica (Laparoscopic Tubal Ligation) La ligadura laparoscpica de las trompas es un procedimiento que cierra las trompas de [place], sin relacin con el momento del [place] Ruizpart[place]. Al cerrar las trompas de [place], los vulos que se liberan de los ovarios no pueden entrar en el tero y los espermatozoides no pueden llegar al vulo. Tambin se la conoce como "Azerbaijanciruga de esterilizacin femenina". La ligadura de trompas significa que no podr quedar embarazada o tener un beb.   Aunque en algunos casos pueda revertirse, debera Erie Insurance Groupconsiderarse como permanente e irreversible. Si desea quedar embarazada en el futuro, no debe realizarse este procedimiento.  INFORME A SU MDICO SOBRE:   Alergias a alimentos o medicamentos.  Medicamentos que [place] Islandsutiliza, incluyendo vitaminas, hierbas, gotas oftlmicas, medicamentos de venta libre y cremas.  Uso de corticoides (por va oral o cremas).  Problemas anteriores debido a anestsicos.  Antecedentes de hemorragias o cogulos sanguneos.  Resfros o infecciones recientes.  Cirugas anteriores.  Otros problemas de salud, incluyendo diabetes y problemas renales.  Posibilidad de embarazo, si corresponde.  Embarazos anteriores. RIESGOS Y COMPLICACIONES   Infecciones.  Sangrado.  Lesin en los rganos circundantes.  Efectos secundarios de Higher education careers adviserla anestesia.  Fallas en el procedimiento.  Vanetta MuldersEmbarazo ectpico  Arrepentimiento por haber realizado el procedimiento ANTES DEL PROCEDIMIENTO   No tome aspirina ni anticoagulantes durante la semana previa al procedimiento, o segn le hayan indicado. Puede ocasionar hemorragias.  No debe comer ni beber nada durante las 6 a 8 horas previas al procedimiento. PROCEDIMIENTO   Antes del procedimiento le darn un medicamento para que pueda relajarse (sedante). Durante el procedimiento le administrarn un medicamento que la har dormir (anestesia general).  Le colocarn un tubo por la garganta para  ayudarla a respirar mientras est bajo los efectos de la anestesia general.  Se realizan dos pequeos cortes (incisiones) en la zona inferior del abdomen, cerca del ombligo.  El rea abdominal se infla con un gas seguro (dixido de carbono) Esto ayuda al cirujano a operar, visualizar y a Sales executiveevitar tocar otros rganos.  Un tubo delgado con una fuente de luz (laparoscopio) y Neomia Dearuna cmara adjunta se inserta en el abdomen a travs de una de las incisiones cerca del ombligo. Tambin se insertan otros instrumentos pequeos a travs de la otra incisin abdominal.  Cuando se encuentran las trompas de Falopio se obstruyen con un anillo, un clip o se queman (cauterizacin).  Despus se libera el gas del abdomen.  Se cierran las incisiones con grapas (suturas) y se coloca un vendaje sobre las incisiones. DESPUS DEL PROCEDIMIENTO   Deber hacer reposo en la sala de recuperacin hasta que se encuentre estable y se sienta bien.  Tendr algunas molestias abdominales leves durante 3 a 7 das. Le darn un medicamento para calmar las molestias.  Luego de esto, si no aparecen complicaciones, podr regresar a su casa. Pdale a alguna persona que la lleve a su casa y que permanezca con usted al menos durante 24 horas.  Podr sentir Administrator, Civil Serviceciertas molestias en la garganta. Es consecuencia de Education officer, communityla colocacin del tubo mientras se encontraba dormida.  Podr sentir Chartered certified accountantalgunas molestias en la zona de los hombros porque queda un poco de aire atrapado entre el hgado y [place] diafragma. Esta sensacin es normal y desaparecer lentamente por s misma. Document Released: 03/15/2005 Document Revised: 09/14/2011 [facility] Patient Information 2015 [place], [place]. This information is not intended to replace advice given to you by your health care provider. Make sure you discuss any questions you have with your  health care provider. Quiste ovrico (Ovarian Cyst) Un quiste ovrico es una bolsa llena de lquido que se forma en el ovario. Los  ovarios son los rganos pequeos que producen vulos en las mujeres. Se pueden formar varios tipos de [employer]. Katha Hamming no son cancerosos. Muchos de ellos no causan problemas y con frecuencia desaparecen solos. Algunos pueden provocar sntomas y requerir TEFL teacher. Los tipos ms comunes de quistes ovricos son los siguientes:  Quistes funcionales: estos quistes pueden aparecer todos los meses durante el ciclo menstrual. Esto es normal. Estos quistes suelen desaparecer con el prximo ciclo menstrual si la mujer no queda embarazada. En general, los quistes funcionales no tienen sntomas.  Endometriomas: estos quistes se forman a partir del tejido que recubre el tero. Tambin se denominan "quistes de chocolate" porque se llenan de sangre que se vuelve marrn. Este tipo de quiste puede Psychologist, counselling en la zona inferior del abdomen durante la relacin sexual y con el perodo menstrual.  Cistoadenomas: este tipo se desarrolla a partir de las clulas que se [place] en el exterior del ovario. Estos quistes pueden ser muy grandes y causar dolor en la zona inferior del abdomen y durante la relacin sexual. Cleda Clarks tipo de quiste puede girar sobre s mismo, cortar el suministro de Retail buyer y causar un dolor intenso. Tambin se puede romper con facilidad y Physicist, medical.  Quistes dermoides: este tipo de quiste a veces se encuentra en ambos ovarios. Estos quistes pueden [employer] tipos de tejidos del organismo, como piel, dientes, pelo o TEFL teacher. Generalmente no tienen sntomas, a menos que sean [address]  Quistes tecalutenicos: aparecen cuando se produce demasiada cantidad de cierta hormona (gonadotropina corinica humana) que estimula en exceso al ovario para que produzca vulos. Esto es ms frecuente despus de procedimientos que ayudan a la concepcin de un beb (fertilizacin in vitro). CAUSAS   Los medicamentos para la fertilidad pueden provocar una afeccin mediante la  cual se forman mltiples quistes de gran tamao en los ovarios. Esta se denomina sndrome de hiperestimulacin ovrica.  El sndrome del ovario poliqustico es una afeccin que puede causar desequilibrios hormonales, los cuales pueden dar como resultado quistes ovricos no funcionales. SIGNOS Y SNTOMAS  Muchos quistes ovricos no causan sntomas. Si se presentan sntomas, stos pueden ser:  Dolor o molestias en la pelvis.  Dolor en la parte baja del abdomen.  Dolor durante las [employer].  Aumento del permetro abdominal (hinchazn).  Perodos menstruales anormales.  Aumento del [employer] perodos [place].  Cese de los perodos menstruales sin estar embarazada. DIAGNSTICO  Estos quistes se descubren comnmente durante un examen de rutina o una exploracin ginecolgica anual. Es posible que se ordenen otros estudios para obtener ms informacin sobre el [place]. Estos estudios pueden ser:  Regulatory affairs officer.  Radiografas de la pelvis.  Tomografa computada.  Resonancia magntica.  Anlisis de [place]. TRATAMIENTO  Muchos de los quistes ovricos desaparecen por s solos, sin tratamiento. Es probable que el mdico quiera controlar el quiste regularmente durante 2 o para ver si se produce algn cambio. En el caso de las mujeres en la menopausia, es particularmente importante controlar de cerca al quiste ya que el ndice de cncer de ovario en las mujeres menopusicas es ms alto. Cuando se requiere TEFL teacher, este puede incluir cualquiera de los siguientes:  Un procedimiento para drenar el quiste (aspiracin). Esto se puede realizar [employer] uso de [place] grande y [place]. Tambin se puede hacer a travs de  un procedimiento laparoscpico, En este procedimiento, se inserta un tubo delgado que emite luz y que tiene una pequea cmara en un extremo (laparoscopio) a travs de una pequea incisin.  Ciruga para extirpar el quiste completo. Esto se puede  realizar mediante una ciruga laparoscpica o Neomia Dear ciruga abierta, la cual implica realizar una incisin ms grande en la parte inferior del abdomen.  Tratamiento hormonal o pldoras anticonceptivas. Estos mtodos a veces se usan para ayudar a Barista. INSTRUCCIONES PARA EL CUIDADO EN EL HOGAR   Tome solo medicamentos de venta libre o recetados, segn las indicaciones del mdico.  Oceanographer a las consultas de control con su mdico segn las indicaciones.  Hgase exmenes plvicos regulares y pruebas de [place]apan[place]. SOLICITE ATENCIN MDICA SI:   Los perodos se atrasan, son irregulares, dolorosos o cesan.  El dolor plvico o abdominal no desaparece.  El abdomen se agranda o se hincha.  Siente presin en la vejiga o no puede vaciarla completamente.  Siente dolor durante las [employer].  Tiene una sensacin de hinchazn, presin o Environmental manager.  Pierde peso sin razn aparente.  Siente un Engineer, maintenance (IT).  Est estreida.  Pierde el apetito.  Le aparece acn.  Nota un aumento del vello corporal y facial.  Lenora Boys de peso sin hacer modificaciones en su actividad fsica y en su dieta habitual.  Sospecha que est embarazada. SOLICITE ATENCIN MDICA DE INMEDIATO SI:   Siente cada vez ms dolor abdominal.  Tiene malestar estomacal (nuseas) y vomita.  Tiene fiebre que se presenta de [place] repentina.  Siente dolor abdominal al defecar.  Sus perodos menstruales son ms abundantes que lo habitual. ASEGRESE DE QUE:   Comprende estas instrucciones.  Controlar su afeccin.  Recibir ayuda de inmediato si no mejora o si empeora. Document Released: 12/23/2004 Document Revised: 03/20/2013 [facility] Patient Information 2015 [place], [place]. This information is not intended to replace advice given to you by your health care provider. Make sure you discuss any questions you have with your health care provider. Laparoscopa  diagnstica (Diagnostic Laparoscopy) La laparoscopa es un procedimiento quirrgico relativamente simple, de uso habitual y breve (menos de una hora) que se lleva a cabo para diagnosticar y tratar enfermedades del abdomen. El laparoscopio (tubo delgado, que emite luz, del tamao de un lpiz y similar a un telescopio) se inserta en el abdomen a travs de una pequea incisin (corte realizado por un cirujano). A travs de este instrumento, el profesional podr observar [employer] rganos del interior del abdomen (vientre) y ver si hay algo anormal. La laparoscopa podr llevarse a cabo tanto en el hospital como en un consultorio. Podrn administrarle un sedante suave que lo ayudar a relajarse antes y durante el procedimiento. Una vez en la sala de operaciones, le administrarn una anestesia general (a menos que usted y el profesional elijan otro tipo de anestesia). Despus de la laparoscopa, que generalmente dura menos de Georgianne Fick, ser Emerson [employer] sala de recuperacin durante algunas horas. Cuando regrese a Pensions consultant, la Arts administrator [employer] y [place]. RIESGOS Y COMPLICACIONES Comparados con los beneficios, los riesgos de la laparoscopia son relativamente pocos. El Animal nutritionist con usted los riesgos antes del procedimiento. Algunos problemas que pueden ocurrir luego de la intervencin son:  Infecciones.  Hemorragias.  Puede ocurrir que se lesionen otros rganos.  Efectos secundarios de Higher education careers adviser. PROCEDIMIENTO Una vez que se encuentra anestesiado, el cirujano insufla el abdomen con un gas inofensivo (dixido de carbono)  para facilitar la observacin de los rganos de la pelvis. El cirujano introduce el laparoscopio a travs de una pequea incisin en el ombligo o alrededor del mismo. Podr insertar otros instrumentos, como una sonda para mover los rganos o Education officer, environmental algn procedimiento a travs de otra pequea incisin.  En ocasiones se toma una biopsia  (muestra de tejido) para un diagnstico ms preciso o para Consulting civil engineer. La biopsia consiste en tomar una pequea muestra de tejido durante la laparoscopia para enviarlo al patlogo (especialista en la observacin de clulas y muestras de tejido) y que lo examine en el microscopio para un diagnstico a nivel de los tejidos. DESPUES DEL PROCEDIMIENTO  Se libera el gas del abdomen.  Las incisiones se cierran con puntos (suturas). Debido a que las incisiones son pequeas (generalmente de menos de 1 cm) las molestias son mnimas luego del procedimiento. Es posible que sienta cierto Sales executive. Es consecuencia de Education officer, community del tubo mientras se encontraba dormido. Es posible que sienta algn dolor abdominal no muy intenso. Tambin podr sentir [employer] en las incisiones realizadas para insertar los instrumentos en el abdomen.  El tiempo de recuperacin es reducido, siempre que no haya habido complicaciones.  Har reposo en la sala de recuperacin hasta que se encuentre estable y se sienta bien. Si no aparecen complicaciones, podr regresar a su casa. AVERIGE LOS RESULTADOS DE SU ANLISIS Durante su visita no contar con todos los [employer]. En este caso, tenga otra entrevista con su mdico para conocerlos. No piense que el resultado es normal si no tiene noticias de su mdico o de la institucin mdica. Es Copy seguimiento de todos los [place] de [place]. INSTRUCCIONES PARA EL CUIDADO DOMICILIARIO  Tome todos los medicamentos tal como se le indic.  Utilice los medicamentos de venta libre o de prescripcin para Chief Technology Officer, Environmental health practitioner o la [place], segn se lo indique el profesional que lo asiste.  Reanude las actividades habituales cuando se le indique.  Es preferible que se duche y no tome baos de inmersin.  Reanude la actividad sexual luego de Horace, o cuando lo autoricen.  No conduzca mientras se encuentre bajo los efectos de  narcticos. SOLICITE ATENCIN MDICA SI:  Siente un dolor abdominal inexplicable.  Siente dolor en los hombros, en la regin de los tirantes.  Si se siente aturdido o siente que se va a Artist.  Siente escalofros.  Usted o su nio tienen una temperatura oral de ms de 38,9 C (102 F).  Observa un drenaje purulento (similar al pus) que proviene de alguna de las heridas.  Usted o su hijo no puede realizar movimientos intestinales o evacuar gases.  Usted o su hijo sufren nuseas o vmitos. EST SEGURO QUE:   Comprende las instrucciones para el alta mdica.  Controlar su enfermedad.  Solicitar atencin mdica de inmediato segn las indicaciones. Document Released: 03/15/2005 Document Revised: 06/07/2011 [facility] Patient Information 2015 [place], [place]. This information is not intended to replace advice given to you by your health care provider. Make sure you discuss any questions you have with your health care provider. Histeroscopa (Hysteroscopy) La histeroscopa es un procedimiento que se [place] para observar el interior de la matriz (tero) Puede indicarse por diferentes motivos, entre ellos:  Para evaluar una hemorragia anormal, un fibroma (tumor benigno, no canceroso), tumores, plipos o tejido cicatrizal Programmer, applications) y la posibilidad de cncer de tero.  Para detectar bultos (tumores) y otros crecimientos anormales uterinos.  Para buscar  las causas por las que una mujer no queda embarazada (infertilidad) causas recurrentes de prdida de embarazo (abortos espontneos) o por la prdida de un dispositivo intrauterino (DIU).  Para realizar un procedimiento de esterilizacin cerrando las trompas de Falopio desde adentro del tero. En este procedimiento, se coloca un tubo delgado y luminoso, con una cmara en el extremo (histeroscopio)para observar el interior del tero. La histeroscopa se realiza inmediatamente despus del perodo menstrual para asegurarse de que no  existe embarazo. INFORME A SU MDICO:   Cualquier alergia que tenga.  Todos los [employer] [place], incluyendo vitaminas, hierbas, gotas oftlmicas, cremas y [address] de [address].  Problemas previos que usted o los [employer] de su familia hayan tenido con el uso de anestsicos.  Enfermedades de [employer].  Cirugas previas.  Padecimientos mdicos. RIESGOS Y COMPLICACIONES  Generalmente es un procedimiento seguro. Sin embargo, Tree surgeon procedimiento, pueden surgir complicaciones. Las complicaciones posibles son:  Perforacin del tero.  Sangrado excesivo.  Infeccin.  Lesin en el cuello del tero.  Lesiones en otros rganos.  Reaccin alrgica a un medicamento.  Inoculacin de lquido en exceso dentro del tero. ANTES DEL PROCEDIMIENTO   Consulte a su mdico si debe cambiar o suspender los medicamentos que toma habitualmente.  No tome aspirina ni anticoagulantes durante la semana previa al procedimiento, o segn le hayan indicado. Pueden ocasionar hemorragias.  Si fuma, no lo haga durante las [employer] al procedimiento.  En algunos casos, el da anterior al procedimiento se coloca un medicamento en el cuello del tero. Este medicamento dilata el cuello y [place] la abertura. Esto facilita la insercin del instrumento en el tero durante el procedimiento.  No debe comer ni beber nada durante al menos 8 horas antes de la [place].  Pdale a alguna persona que la lleve a su casa luego del procedimiento. PROCEDIMIENTO   Le administrarn un medicamento para relajarse (sedante). Tambin podrn administrarle uno de los siguientes medicamentos:  Medicamentos que adormecen el rea del cuello uterino (anestesia local).  Un medicamento para que duerma durante el procedimiento (anestesia genera).  El histeroscopio se inserta a travs de la vagina, dentro del tero. La cmara del laparoscopio enva una imagen a una pantalla de televisin que se  encuentra en el quirfano. [employer] modo, el mdico tendr Neomia Dear buena visin del interior del tero.  Durante el IT consultant un lquido o aire en el interior de [place], lo que le permitir al cirujano observar mejor.  En algunas ocasiones, el tejido del interior del tero se legra suavemente. Esas muestras de tejido se envan al laboratorio para ser examinadas. DESPUS DEL PROCEDIMIENTO   Si le han administrado anestesia general podr sentirse atontada durante algunas horas despus del procedimiento.  Si se utiliza Associate Professor, podr volver a su casa tan pronto como est estable y se sienta en condiciones.  Podr sentir clicos leves. Es normal que esto dure un par [employer].  Podr tener una hemorragia, la que puede variar entre una pequea mancha durante algunos das hasta una hemorragia similar a Animal nutritionist durante 3-7 [address]  [address]. Esto es normal.  Si el resultado de los estudios no estn listos durante la visita, tenga otra entrevista con su mdico para conocerlos. Document Released: 03/15/2005 Document Revised: 01/03/2013 [facility] Patient Information 2015 [place], [place]. This information is not intended to replace advice given to you by your health care provider. Make sure you discuss any questions you have with your health care provider.

## 2014-04-17 NOTE — Progress Notes (Signed)
   Patient presented to the office for follow-up. Patient was last seen in the office on January 11. Her history is as follows:  Patient is a 39 year old gravida 3 para 3 (first pregnancy resulting normal spontaneous vaginal delivery, second pregnancy resulting in cesarean section, third pregnancy July of this year vaginal birth after C-section) who presented to the office today with dysfunctional uterine bleeding since birth of her child. She stated that she breast-fed for one month. Due to her bleeding she is very infrequent weighing having any intercourse. Patient had elevated liver function tests during her pregnancy but has had no follow-up. She did have her six-week postpartum visit. The only result I have on record is her hemoglobin was 9.7. She was supposed to be taking iron supplementation. Patient denies any visual disturbances any unusual headache or any nipple discharge.  Her blood work on that office visit demonstrated a normal competence metabolic panel with blood sugar 106 which was random. Her hemoglobin was found to be 6.5 hematocrit 22.1 platelet count 367,000 her MCV, MCH and MCHC were all low and reticulocyte count was elevated. She was started on iron tablet one by mouth twice a day and on Megace 40 mg twice a day and was instructed to return today for follow-up ultrasound/sono hysterogram. She is no longer bleeding and was otherwise asymptomatic. Her blood pressure was 120/72 and her pulse was 80.  Her endometrial biopsy done on January 11 demonstrated the following: Diagnosis Endometrium, biopsy - INTERVAL PHASE ENDOMETRIUM (LATE PROLIFERATIVE/EARLY SECRETORY). - THERE IS NO EVIDENCE OF HYPERPLASIA OR MALIGNANCY.  Ultrasound today: Uterus measured 10.8 x 7.3 x 5.4 cm endometrial stripe 30.4 mm. A right thin wall echo-free avascular cyst measuring 54 x 31 x 46 mm was noted average size 4.4 cm. Left ovary was normal. The cervix was then cleansed with Betadine solution and a sterile  catheter was introduced into the uterine cavity irregular endometrial wall numerous defects consistent with possible endometrial polyps as follows 20 x 10 mm, 16 x 10 mm, 15 x 12 mm and uterine synechia was noted. There was no blood noted in the bulb during the time of sonohysterogram.  Assessment/plan: Patient 6 month status post vaginal delivery July 2015 with irregular bleeding. This has lead to anemia. Patient was started on Megace 40 mg twice a day and iron supplementation 1 twice a day for anemia. Sonohysterogram highly suspicious for the peritoneum endometrial polyps. She will continue on the Megace. She will continue on iron supplementation. We are going to schedule the following in 4 weeks: Ultrasound to follow-up on the ovarian cyst. Due to her preop exam. Patient has voiced that she would like to have tubal sterilization. We will plan on a laparoscopic ovarian cystectomy and bilateral salpingectomy along with resectoscopic polypectomy. We are going to check her CBC today. Literature information was provided in [place].

## 2014-04-25 ENCOUNTER — Telehealth: Payer: Self-pay

## 2014-04-25 NOTE — Telephone Encounter (Signed)
I called patient and discussed her ins benefits and financial responsibility for surgery.  I scheduled her Lap R Cystectomy and Bilat Salpingectomy with Myosure resection of polyp [facility]   Per Dr. Glenetta HewJF request she will come to the office on 05/22/14 for CBC and return on 05/23/14 at 3:30pm for u/s and pre op consult with Dr. Glenetta HewJF.  [facility].

## 2014-04-27 IMAGING — CR DG ABDOMEN ACUTE W/ 1V CHEST
3 series · 3 of 3 positions shown · non-contrast
Comparison: Chest radiographs 07/05/2012.

CLINICAL DATA: Constipation with blood in stool

ACUTE ABDOMEN SERIES (ABDOMEN 2 VIEW & CHEST 1 VIEW)

[PA]
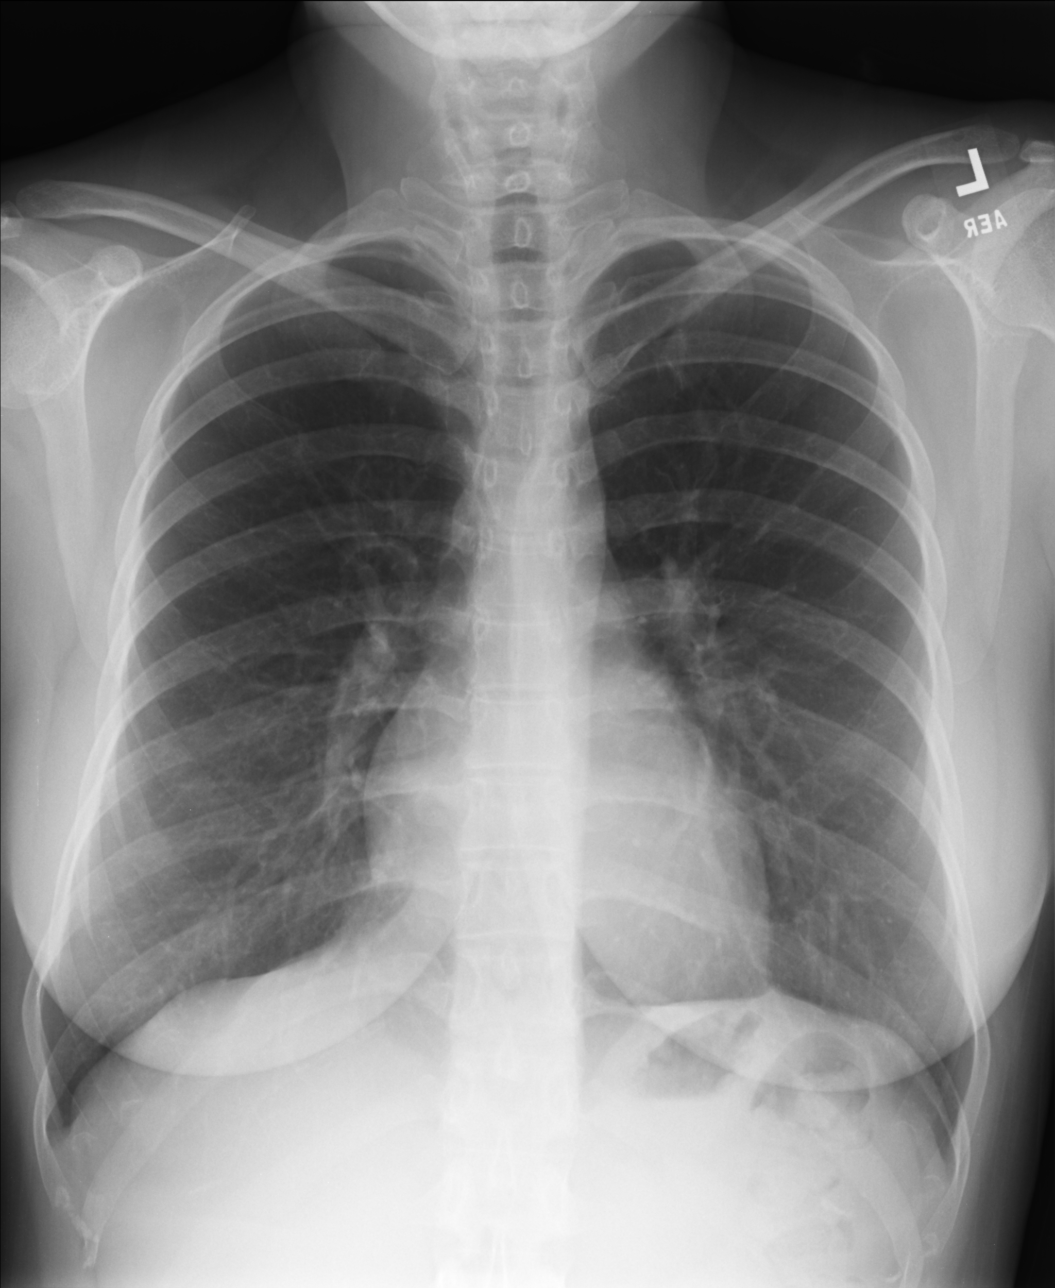

[AP (1 of 2)]
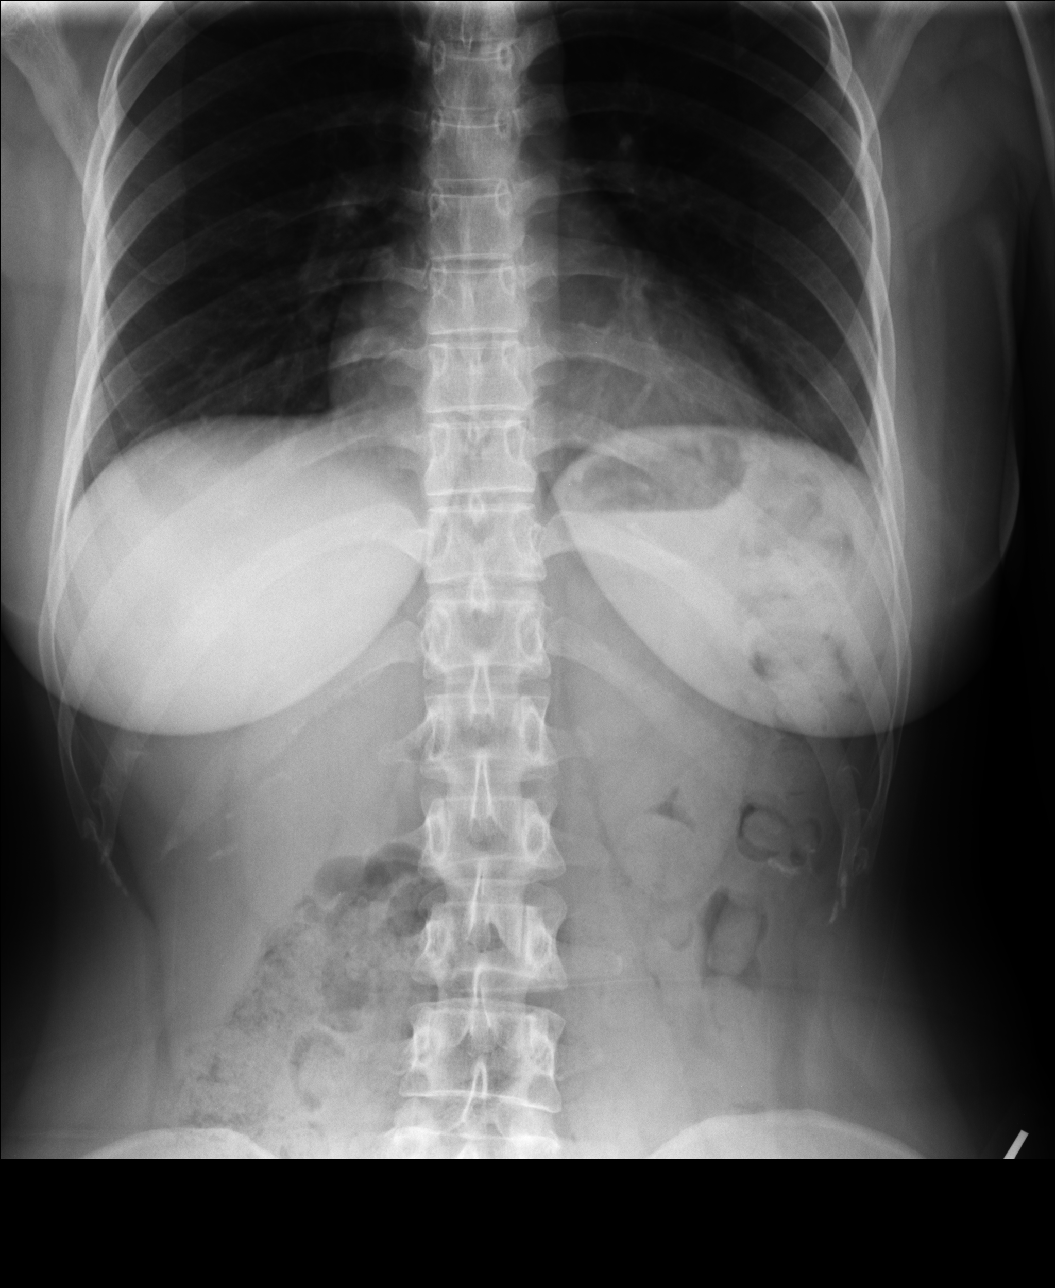

[AP (2 of 2)]
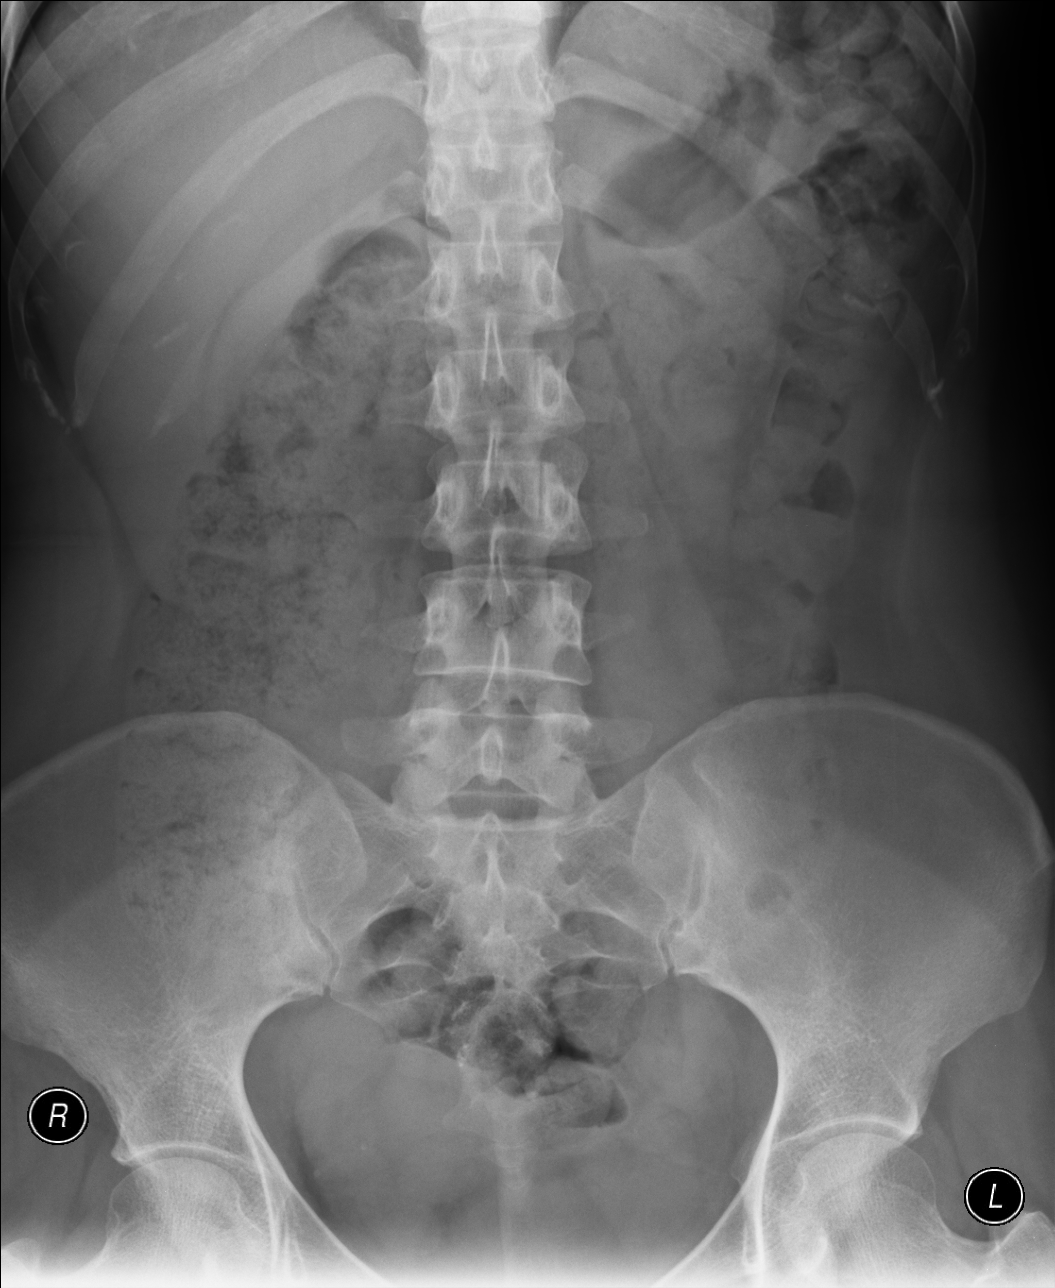

[3 of 3 positions shown; findings below may reference images not displayed]

FINDINGS: The heart size and mediastinal contours are normal. The
lungs are clear. There is no pleural effusion or pneumothorax. No
acute osseous findings are identified.

Moderate stool is present throughout the colon.  There is no bowel
distension, wall thickening or pneumoperitoneum.  There are no
suspicious abdominal calcifications.  Small right-sided pelvic
calcifications are likely phleboliths.  The osseous structures
appear normal.
IMPRESSION: 1.  Prominent stool throughout the colon suggesting constipation.
2.  No acute cardiopulmonary or abdominal process identified.

## 2014-05-14 ENCOUNTER — Other Ambulatory Visit: Payer: Self-pay | Admitting: *Deleted

## 2014-05-14 DIAGNOSIS — N83201 Unspecified ovarian cyst, right side: Secondary | ICD-10-CM

## 2014-05-14 DIAGNOSIS — N921 Excessive and frequent menstruation with irregular cycle: Secondary | ICD-10-CM

## 2014-05-17 NOTE — Patient Instructions (Addendum)
   Your procedure is scheduled on:  Tuesday, March 1  [facility] at: 8:30 AM Pick up the phone at the desk and dial [phone] and inform us of your arrival.  Please call this number if you have any problems the morning of surgery: [phone]  Remember: Do not eat or drink after midnight: Monday Take these medicines the morning of surgery with a SIP OF WATER:  None  Do not wear jewelry, make-up, or FINGER nail polish No metal in your hair or on your body. Do not wear lotions, powders, perfumes.  You may wear deodorant.  Do not bring valuables to the hospital. Contacts, dentures or bridgework may not be worn into surgery.   Patients discharged on the day of surgery will not be allowed to drive home.  Home with husband [place] cell [phone]

## 2014-05-20 ENCOUNTER — Encounter (HOSPITAL_COMMUNITY)
Admission: RE | Admit: 2014-05-20 | Discharge: 2014-05-20 | Disposition: A | Discharge status: Home / Self Care | Payer: BLUE CROSS/BLUE SHIELD | Source: Ambulatory Visit | Attending: Gynecology | Admitting: Gynecology

## 2014-05-20 ENCOUNTER — Encounter (HOSPITAL_COMMUNITY): Payer: Self-pay

## 2014-05-20 DIAGNOSIS — N921 Excessive and frequent menstruation with irregular cycle: Secondary | ICD-10-CM | POA: Diagnosis not present

## 2014-05-20 DIAGNOSIS — N8320 Unspecified ovarian cysts: Secondary | ICD-10-CM | POA: Diagnosis not present

## 2014-05-20 DIAGNOSIS — Z01818 Encounter for other preprocedural examination: Secondary | ICD-10-CM | POA: Insufficient documentation

## 2014-05-20 DIAGNOSIS — N840 Polyp of corpus uteri: Secondary | ICD-10-CM | POA: Diagnosis not present

## 2014-05-20 HISTORY — DX: Anemia, unspecified: D64.9

## 2014-05-20 LAB — CBC
HCT: 35.6 % — ABNORMAL LOW (ref 36.0–46.0)
Hemoglobin: 10.6 g/dL — ABNORMAL LOW (ref 12.0–15.0)
MCH: 20.5 pg — AB (ref 26.0–34.0)
MCHC: 29.8 g/dL — ABNORMAL LOW (ref 30.0–36.0)
MCV: 69 fL — AB (ref 78.0–100.0)
PLATELETS: 273 10*3/uL (ref 150–400)
RBC: 5.16 MIL/uL — ABNORMAL HIGH (ref 3.87–5.11)
RDW: 23.9 % — ABNORMAL HIGH (ref 11.5–15.5)
WBC: 7.8 10*3/uL (ref 4.0–10.5)

## 2014-05-20 LAB — URINALYSIS, ROUTINE W REFLEX MICROSCOPIC
Bilirubin Urine: NEGATIVE
GLUCOSE, UA: NEGATIVE mg/dL
HGB URINE DIPSTICK: NEGATIVE
Ketones, ur: NEGATIVE mg/dL
NITRITE: NEGATIVE
Protein, ur: NEGATIVE mg/dL
Urobilinogen, UA: 0.2 mg/dL (ref 0.0–1.0)
pH: 6 (ref 5.0–8.0)

## 2014-05-20 LAB — URINE MICROSCOPIC-ADD ON

## 2014-05-22 ENCOUNTER — Encounter: Payer: Self-pay | Admitting: Gynecology

## 2014-05-22 ENCOUNTER — Ambulatory Visit (INDEPENDENT_AMBULATORY_CARE_PROVIDER_SITE_OTHER): Payer: BLUE CROSS/BLUE SHIELD | Admitting: Gynecology

## 2014-05-22 ENCOUNTER — Other Ambulatory Visit: Payer: Self-pay | Admitting: Gynecology

## 2014-05-22 ENCOUNTER — Ambulatory Visit (INDEPENDENT_AMBULATORY_CARE_PROVIDER_SITE_OTHER): Payer: BLUE CROSS/BLUE SHIELD

## 2014-05-22 ENCOUNTER — Other Ambulatory Visit: Payer: BLUE CROSS/BLUE SHIELD

## 2014-05-22 VITALS — BP 116/72

## 2014-05-22 DIAGNOSIS — Z302 Encounter for sterilization: Secondary | ICD-10-CM

## 2014-05-22 DIAGNOSIS — R102 Pelvic and perineal pain unspecified side: Secondary | ICD-10-CM

## 2014-05-22 DIAGNOSIS — Z789 Other specified health status: Secondary | ICD-10-CM

## 2014-05-22 DIAGNOSIS — R9389 Abnormal findings on diagnostic imaging of other specified body structures: Secondary | ICD-10-CM

## 2014-05-22 DIAGNOSIS — N921 Excessive and frequent menstruation with irregular cycle: Secondary | ICD-10-CM

## 2014-05-22 DIAGNOSIS — Z01818 Encounter for other preprocedural examination: Secondary | ICD-10-CM

## 2014-05-22 DIAGNOSIS — N8320 Unspecified ovarian cysts: Secondary | ICD-10-CM

## 2014-05-22 DIAGNOSIS — N83201 Unspecified ovarian cyst, right side: Secondary | ICD-10-CM

## 2014-05-22 DIAGNOSIS — R938 Abnormal findings on diagnostic imaging of other specified body structures: Secondary | ICD-10-CM

## 2014-05-22 DIAGNOSIS — N840 Polyp of corpus uteri: Secondary | ICD-10-CM

## 2014-05-22 DIAGNOSIS — D500 Iron deficiency anemia secondary to blood loss (chronic): Secondary | ICD-10-CM

## 2014-05-22 MED ORDER — OXYCODONE-ACETAMINOPHEN 5-325 MG PO TABS: 1.0000 | ORAL_TABLET | ORAL | Status: DC | PRN

## 2014-05-22 MED ORDER — METOCLOPRAMIDE HCL 10 MG PO TABS: 10.0000 mg | ORAL_TABLET | Freq: Three times a day (TID) | ORAL | Status: DC

## 2014-05-22 NOTE — Progress Notes (Signed)
 Sheila Foster is an 39 y.o. female. For preoperative examination as well as to discuss for follow-up ultrasound. Her history is as follows:  Patient is a 39-year-old gravida 3 para 3 (first pregnancy resulting normal spontaneous vaginal delivery, second pregnancy resulting in cesarean section, third pregnancy July of this year vaginal birth after C-section) who presented to the office today with dysfunctional uterine bleeding since birth of her child. She stated that she breast-fed for one month.She did have her six-week postpartum visit. The only result I have on record is her hemoglobin was 9.7. She was supposed to be taking iron supplementation. Patient denies any visual disturbances any unusual headache or any nipple discharge.  Patient since then had been placed on iron supplementation 1 tablet twice a day her most recent CBC on February 22 demonstrated her hemoglobin 10.6 and hematocrit 35.6 with a platelet count of 273,000. She also had a normal comprehensive metabolic panel in January this year. Since the office visit of 04/17/2014 should was placed on Megace 40 mg twice a day and she's currently not bleeding today.  Her endometrial biopsy done on January 11 demonstrated the following: Diagnosis Endometrium, biopsy - INTERVAL PHASE ENDOMETRIUM (LATE PROLIFERATIVE/EARLY SECRETORY). - THERE IS NO EVIDENCE OF HYPERPLASIA OR MALIGNANCY.  Ultrasound/sono hysterogram done on the last office visit 04/17/2014 demonstrated the following: Uterus measured 10.8 x 7.3 x 5.4 cm with an endometrial stripe of 30.4 mm.A right thin wall echo-free avascular cyst measuring 54 x 31 x 46 mm was noted average size 4.4 cm. Left ovary was normal. The cervix was then cleansed with Betadine solution and a sterile catheter was introduced into the uterine cavity irregular endometrial wall numerous defects consistent with possible endometrial polyps as follows 20 x 10 mm, 16 x 10 mm, 15 x 12 mm and uterine synechia  was noted.  Her ultrasound today demonstrated the following: Uterus measured 10.9 x 7.9 x 6.7 cm with endometrial stripe of 48.8 mm. Prominent endometrial cavity with positive vascular flow was noted. Right ovary previous large cyst no longer present she had a small echo-free follicle measuring 23 x 15 mm. Left ovary was normal. No fluid in the cul-de-sac in no apparent adnexal masses. Patient has complaining on and off the past few days a left lower quadrant discomfort but was in no acute distress today.  Patient is being scheduled to undergo diagnostic laparoscopy with bilateral salpingectomy as part of her sterilization request as well as resectoscopic polypectomy for her this onto uterine bleeding contributing to her anemia.     Pertinent Gynecological History: Menses: dysfuctional Bleeding: dysfunctional uterine bleeding Contraception: abstinence DES exposure: denies Blood transfusions: none Sexually transmitted diseases: no past history Previous GYN Procedures:  2 NSVD one C section  Last mammogram: normal Date: not indicated Last pap: normal Date: 2016 OB History: G3, P3   Menstrual History: Menarche age: 17  Patient's last menstrual period was 04/17/2014 (approximate).    Past Medical History  Diagnosis Date  . Constipation   . Shingles 2010  . Contraception     condoms   . Cholelithiasis affecting pregnancy in third trimester, antepartum   . Endometritis July 2015  . SVD (spontaneous vaginal delivery) 1998, 2015    x 2  . Anemia     Past Surgical History  Procedure Laterality Date  . Cesarean section  2004    x1  . Wisdom tooth extraction      Family History  Problem Relation Age of Onset  . Heart disease   Father     MI age 60  . Hypertension Father   . Diabetes Father   . Colon cancer Neg Hx   . Breast cancer Neg Hx     Social History:  reports that she has never smoked. She has never used smokeless tobacco. She reports that she does not drink alcohol  or use illicit drugs.  Allergies: No Known Allergies   (Not in a hospital admission)  REVIEW OF SYSTEMS: A ROS was performed and pertinent positives and negatives are included in the history.  GENERAL: No fevers or chills. HEENT: No change in vision, no earache, sore throat or sinus congestion. NECK: No pain or stiffness. CARDIOVASCULAR: No chest pain or pressure. No palpitations. PULMONARY: No shortness of breath, cough or wheeze. GASTROINTESTINAL: No abdominal pain, nausea, vomiting or diarrhea, melena or bright red blood per rectum. GENITOURINARY: No urinary frequency, urgency, hesitancy or dysuria. MUSCULOSKELETAL: No joint or muscle pain, no back pain, no recent trauma. DERMATOLOGIC: No rash, no itching, no lesions. ENDOCRINE: No polyuria, polydipsia, no heat or cold intolerance. No recent change in weight. HEMATOLOGICAL: No anemia or easy bruising or bleeding. NEUROLOGIC: No headache, seizures, numbness, tingling or weakness. PSYCHIATRIC: No depression, no loss of interest in normal activity or change in sleep pattern.     Blood pressure 116/72, last menstrual period 04/17/2014, not currently breastfeeding.  Physical Exam:  HEENT:unremarkable Neck:Supple, midline, no thyroid megaly, no carotid bruits Lungs:  Clear to auscultation no rhonchi's or wheezes Heart:Regular rate and rhythm, no murmurs or gallops Breast Exam: Both breasts were examined sitting supine position there were symmetrical in appearance no skin discoloration or nipple inversion no palpable masses or tenderness no supraclavicular axillary lymphadenopathy Abdomen: Soft nontender no rebound or guarding midline scar from previous cesarean section was noted Pelvic:BUS within normal limits Vagina: No lesions or discharge Cervix: No lesions or discharge Uterus: Anteverted normal size shape and consistency Adnexa: No masses or tenderness Extremities: No cords, no edema Rectal: Not done   Assessment/Plan: Patient with  dysfunctional uterine bleeding attributed to endometrial polyps were noted on sonohysterogram. Normal endometrial biopsy recently. Patient currently on Megace 40 mg twice a day as well as iron supplementation 1 by mouth twice a day. Currently not bleeding today. Patient's hemoglobin has improved from 6.5 g on 04/08/2014 up to 10.6 on 05/20/2014. Patient scheduled to undergo resectoscopic polypectomy along with laparoscopic bilateral salpingectomy. Patient is fully aware that after this operation she will no longer be able to have any children. This is her request to undergo sterilization as well under 1 anesthesia. Additional risks discussed with patient are as follows:                        Patient was counseled as to the risk of surgery to include the following:  1. Infection (prohylactic antibiotics will be administered)  2. DVT/Pulmonary Embolism (prophylactic pneumo compression stockings will be used)  3.Trauma to internal organs requiring additional surgical procedure to repair any injury to     Internal organs requiring perhaps additional hospitalization days.  4.Hemmorhage requiring transfusion and blood products which carry risks such as             anaphylactic reaction, hepatitis and AIDS  Patient had received literature information on the procedure scheduled and all her questions were answered and fully accepts all risk.   Sheila Foster HMD9:57 AMTD@Note:     Sheila Foster H 05/22/2014, 9:21 AM   

## 2014-05-23 ENCOUNTER — Ambulatory Visit: Payer: BLUE CROSS/BLUE SHIELD | Admitting: Gynecology

## 2014-05-23 ENCOUNTER — Other Ambulatory Visit: Payer: BLUE CROSS/BLUE SHIELD

## 2014-05-23 ENCOUNTER — Telehealth: Payer: Self-pay

## 2014-05-23 ENCOUNTER — Encounter: Payer: Self-pay | Admitting: Internal Medicine

## 2014-05-23 NOTE — Telephone Encounter (Signed)
Scheduled for surgery Tuesday, 3/1.  Laparoscopic Cystectomy and D&C,Hyst with Novasure.  She said she told you that the Megace bid was making her dizzy and you told her okay to take one daily.  She was taking one daily but now she is bleeding.  She is concerned she won't be able to have her surgery if she is bleeding?

## 2014-05-24 NOTE — Telephone Encounter (Signed)
Tell her we should be able to stilldo her surgery. Have her take megace 1/2 tablet twice a day with food

## 2014-05-24 NOTE — Telephone Encounter (Signed)
Claudia informed patient.  °

## 2014-05-27 MED ORDER — CEFOTETAN DISODIUM 2 G IJ SOLR
2.0000 g | INTRAMUSCULAR | Status: AC
  Administered 2014-05-28: 2 g via INTRAVENOUS
  Filled 2014-05-27: qty 2

## 2014-05-28 ENCOUNTER — Encounter (HOSPITAL_COMMUNITY)
Admission: RE | Disposition: A | Discharge status: Home / Self Care | Payer: Self-pay | Source: Ambulatory Visit | Attending: Gynecology

## 2014-05-28 ENCOUNTER — Ambulatory Visit (HOSPITAL_COMMUNITY): Payer: BLUE CROSS/BLUE SHIELD | Admitting: Anesthesiology

## 2014-05-28 ENCOUNTER — Encounter (HOSPITAL_COMMUNITY): Payer: Self-pay | Admitting: Anesthesiology

## 2014-05-28 ENCOUNTER — Ambulatory Visit (HOSPITAL_COMMUNITY)
Admission: RE | Admit: 2014-05-28 | Discharge: 2014-05-28 | Disposition: A | Discharge status: Home / Self Care | Payer: BLUE CROSS/BLUE SHIELD | Source: Ambulatory Visit | Attending: Gynecology | Admitting: Gynecology

## 2014-05-28 DIAGNOSIS — N8320 Unspecified ovarian cysts: Secondary | ICD-10-CM | POA: Insufficient documentation

## 2014-05-28 DIAGNOSIS — N921 Excessive and frequent menstruation with irregular cycle: Secondary | ICD-10-CM | POA: Insufficient documentation

## 2014-05-28 DIAGNOSIS — N938 Other specified abnormal uterine and vaginal bleeding: Secondary | ICD-10-CM | POA: Diagnosis not present

## 2014-05-28 DIAGNOSIS — B029 Zoster without complications: Secondary | ICD-10-CM | POA: Insufficient documentation

## 2014-05-28 DIAGNOSIS — N736 Female pelvic peritoneal adhesions (postinfective): Secondary | ICD-10-CM

## 2014-05-28 DIAGNOSIS — K5900 Constipation, unspecified: Secondary | ICD-10-CM | POA: Diagnosis not present

## 2014-05-28 DIAGNOSIS — K219 Gastro-esophageal reflux disease without esophagitis: Secondary | ICD-10-CM | POA: Diagnosis not present

## 2014-05-28 DIAGNOSIS — D649 Anemia, unspecified: Secondary | ICD-10-CM | POA: Diagnosis not present

## 2014-05-28 DIAGNOSIS — N840 Polyp of corpus uteri: Secondary | ICD-10-CM | POA: Diagnosis not present

## 2014-05-28 DIAGNOSIS — Z302 Encounter for sterilization: Secondary | ICD-10-CM | POA: Insufficient documentation

## 2014-05-28 DIAGNOSIS — Z789 Other specified health status: Secondary | ICD-10-CM | POA: Diagnosis not present

## 2014-05-28 HISTORY — PX: LAPAROSCOPIC OVARIAN CYSTECTOMY: SHX6248

## 2014-05-28 HISTORY — PX: DILATATION & CURETTAGE/HYSTEROSCOPY WITH MYOSURE: SHX6511

## 2014-05-28 HISTORY — PX: LAPAROSCOPIC LYSIS OF ADHESIONS: SHX5905

## 2014-05-28 HISTORY — PX: BILATERAL SALPINGECTOMY: SHX5743

## 2014-05-28 LAB — PREGNANCY, URINE: Preg Test, Ur: NEGATIVE

## 2014-05-28 SURGERY — EXCISION, CYST, OVARY, LAPAROSCOPIC
Anesthesia: General | Site: Uterus | Laterality: Right

## 2014-05-28 MED ORDER — GLYCOPYRROLATE 0.2 MG/ML IJ SOLN
INTRAMUSCULAR | Status: DC | PRN
  Administered 2014-05-28: .6 mg via INTRAVENOUS

## 2014-05-28 MED ORDER — NEOSTIGMINE METHYLSULFATE 10 MG/10ML IV SOLN
INTRAVENOUS | Status: DC | PRN
  Administered 2014-05-28: 3 mg via INTRAVENOUS

## 2014-05-28 MED ORDER — SCOPOLAMINE 1 MG/3DAYS TD PT72
MEDICATED_PATCH | TRANSDERMAL | Status: AC
  Administered 2014-05-28: 1.5 mg via TRANSDERMAL
  Filled 2014-05-28: qty 1

## 2014-05-28 MED ORDER — ONDANSETRON HCL 4 MG/2ML IJ SOLN: 4.0000 mg | Freq: Once | INTRAMUSCULAR | Status: DC | PRN

## 2014-05-28 MED ORDER — ONDANSETRON HCL 4 MG/2ML IJ SOLN
INTRAMUSCULAR | Status: DC | PRN
  Administered 2014-05-28: 4 mg via INTRAVENOUS

## 2014-05-28 MED ORDER — SCOPOLAMINE 1 MG/3DAYS TD PT72
1.0000 | MEDICATED_PATCH | Freq: Once | TRANSDERMAL | Status: DC
  Administered 2014-05-28: 1.5 mg via TRANSDERMAL

## 2014-05-28 MED ORDER — LACTATED RINGERS IV SOLN
INTRAVENOUS | Status: DC
  Administered 2014-05-28 (×3): via INTRAVENOUS

## 2014-05-28 MED ORDER — DEXAMETHASONE SODIUM PHOSPHATE 4 MG/ML IJ SOLN
INTRAMUSCULAR | Status: DC | PRN
  Administered 2014-05-28: 4 mg via INTRAVENOUS

## 2014-05-28 MED ORDER — SODIUM CHLORIDE 0.9 % IJ SOLN
INTRAMUSCULAR | Status: AC
  Filled 2014-05-28: qty 50

## 2014-05-28 MED ORDER — BUPIVACAINE HCL (PF) 0.25 % IJ SOLN
INTRAMUSCULAR | Status: AC
  Filled 2014-05-28: qty 30

## 2014-05-28 MED ORDER — BUPIVACAINE HCL (PF) 0.25 % IJ SOLN
INTRAMUSCULAR | Status: DC | PRN
  Administered 2014-05-28: 20 mL

## 2014-05-28 MED ORDER — VASOPRESSIN 20 UNIT/ML IV SOLN
INTRAVENOUS | Status: DC | PRN
  Administered 2014-05-28: 20 [IU]

## 2014-05-28 MED ORDER — PROPOFOL INFUSION 10 MG/ML OPTIME
INTRAVENOUS | Status: DC | PRN
  Administered 2014-05-28: 150 mL via INTRAVENOUS

## 2014-05-28 MED ORDER — SODIUM CHLORIDE 0.9 % IR SOLN
Status: DC | PRN
  Administered 2014-05-28: 3000 mL

## 2014-05-28 MED ORDER — MEPERIDINE HCL 25 MG/ML IJ SOLN: 6.2500 mg | INTRAMUSCULAR | Status: DC | PRN

## 2014-05-28 MED ORDER — VASOPRESSIN 20 UNIT/ML IV SOLN
INTRAVENOUS | Status: AC
  Filled 2014-05-28: qty 1

## 2014-05-28 MED ORDER — ROCURONIUM BROMIDE 100 MG/10ML IV SOLN
INTRAVENOUS | Status: DC | PRN
  Administered 2014-05-28: 5 mg via INTRAVENOUS
  Administered 2014-05-28: 10 mg via INTRAVENOUS
  Administered 2014-05-28: 35 mg via INTRAVENOUS
  Administered 2014-05-28: 10 mg via INTRAVENOUS

## 2014-05-28 MED ORDER — MIDAZOLAM HCL 5 MG/5ML IJ SOLN
INTRAMUSCULAR | Status: DC | PRN
  Administered 2014-05-28: 2 mg via INTRAVENOUS

## 2014-05-28 MED ORDER — FENTANYL CITRATE 0.05 MG/ML IJ SOLN
25.0000 ug | INTRAMUSCULAR | Status: DC | PRN
  Administered 2014-05-28 (×3): 25 ug via INTRAVENOUS

## 2014-05-28 MED ORDER — FENTANYL CITRATE 0.05 MG/ML IJ SOLN
INTRAMUSCULAR | Status: AC
  Filled 2014-05-28: qty 2

## 2014-05-28 MED ORDER — LACTATED RINGERS IR SOLN
Status: DC | PRN
  Administered 2014-05-28: 1

## 2014-05-28 MED ORDER — FENTANYL CITRATE 0.05 MG/ML IJ SOLN
INTRAMUSCULAR | Status: DC | PRN
  Administered 2014-05-28 (×2): 100 ug via INTRAVENOUS
  Administered 2014-05-28: 50 ug via INTRAVENOUS

## 2014-05-28 SURGICAL SUPPLY — 38 items
CANISTERS HI-FLOW 3000CC (CANNISTER) ×6 IMPLANT
CATH ROBINSON RED A/P 16FR (CATHETERS) ×6 IMPLANT
CLOTH BEACON ORANGE TIMEOUT ST (SAFETY) ×6 IMPLANT
CONTAINER PREFILL 10% NBF 60ML (FORM) ×12 IMPLANT
COVER MAYO STAND STRL (DRAPES) ×6 IMPLANT
DEVICE MYOSURE LITE (MISCELLANEOUS) ×6 IMPLANT
DRSG COVADERM PLUS 2X2 (GAUZE/BANDAGES/DRESSINGS) ×6 IMPLANT
DRSG OPSITE POSTOP 3X4 (GAUZE/BANDAGES/DRESSINGS) ×6 IMPLANT
FILTER ARTHROSCOPY CONVERTOR (FILTER) ×6 IMPLANT
FILTER SMOKE EVAC LAPAROSHD (FILTER) ×6 IMPLANT
GLOVE BIOGEL PI IND STRL 8 (GLOVE) ×4 IMPLANT
GLOVE BIOGEL PI INDICATOR 8 (GLOVE) ×2
GLOVE ECLIPSE 7.5 STRL STRAW (GLOVE) ×12 IMPLANT
GOWN STRL REUS W/TWL LRG LVL3 (GOWN DISPOSABLE) ×12 IMPLANT
LIQUID BAND (GAUZE/BANDAGES/DRESSINGS) ×6 IMPLANT
NS IRRIG 1000ML POUR BTL (IV SOLUTION) ×6 IMPLANT
PACK LAPAROSCOPY BASIN (CUSTOM PROCEDURE TRAY) ×6 IMPLANT
PACK VAGINAL MINOR WOMEN LF (CUSTOM PROCEDURE TRAY) ×6 IMPLANT
PAD OB MATERNITY 4.3X12.25 (PERSONAL CARE ITEMS) ×6 IMPLANT
PAD POSITIONER PINK NONSTERILE (MISCELLANEOUS) ×6 IMPLANT
PAD PREP 24X48 CUFFED NSTRL (MISCELLANEOUS) ×6 IMPLANT
PROTECTOR NERVE ULNAR (MISCELLANEOUS) ×6 IMPLANT
SEAL ROD LENS SCOPE MYOSURE (ABLATOR) ×6 IMPLANT
SET IRRIG TUBING LAPAROSCOPIC (IRRIGATION / IRRIGATOR) ×6 IMPLANT
SHEARS HARMONIC ACE PLUS 36CM (ENDOMECHANICALS) ×6 IMPLANT
SLEEVE XCEL OPT CAN 5 100 (ENDOMECHANICALS) ×6 IMPLANT
SUT VIC AB 3-0 PS2 18 (SUTURE) ×2
SUT VIC AB 3-0 PS2 18XBRD (SUTURE) ×4 IMPLANT
SUT VICRYL 0 UR6 27IN ABS (SUTURE) ×6 IMPLANT
TOWEL OR 17X24 6PK STRL BLUE (TOWEL DISPOSABLE) ×12 IMPLANT
TRAY FOLEY CATH 14FR (SET/KITS/TRAYS/PACK) ×6 IMPLANT
TROCAR XCEL NON-BLD 11X100MML (ENDOMECHANICALS) ×6 IMPLANT
TROCAR XCEL NON-BLD 5MMX100MML (ENDOMECHANICALS) ×6 IMPLANT
TUBING AQUILEX INFLOW (TUBING) ×6 IMPLANT
TUBING AQUILEX OUTFLOW (TUBING) ×6 IMPLANT
VACURETTE 9 RIGID CVD (CANNULA) ×6 IMPLANT
WARMER LAPAROSCOPE (MISCELLANEOUS) ×6 IMPLANT
WATER STERILE IRR 1000ML POUR (IV SOLUTION) ×6 IMPLANT

## 2014-05-28 NOTE — Op Note (Signed)
Operative Note  05/28/2014  1:04 PM  PATIENT:  Sheila Foster  39 y.o. female  PRE-OPERATIVE DIAGNOSIS:  right ovarian cyst, desire for sterilization,metromenorrhagia, endometrial polyps  POST-OPERATIVE DIAGNOSIS:  right ovarian cyst, desire for sterilization,metromenorrhagia, endometrial polyps  PROCEDURE:  Procedure(s): LAPAROSCOPIC RIGHT ovarian cyst aspiration BILATERAL SALPINGECTOMY DILATATION & CURETTAGE/HYSTEROSCOPY WITH MYOSURE/polypectomy LAPAROSCOPIC LYSIS OF ADHESIONS (extensive)  SURGEON:  Surgeon(s): Ok Edwards, MD Dara Lords, MD  ANESTHESIA:   general  FINDINGS: Patient had extensive abdominal pelvic adhesions from previous midline scar/cesarean section. Simple appearing 2 cm right ovarian cyst left ovary was normal. Normal fallopian tubes. Omentum adhered to the fundal aspect of the uterus., Cul-de-sac free of adhesions and endometriotic implants. Smooth gallbladder and liver surface seen. Appendix not visualized  DESCRIPTION OF OPERATION: The patient was taken to the operating room where she underwent general endotracheal anesthesia. A timeout was undertaken to discuss the case to be performed and for proper identification. The abdomen vagina and perineum were prepped and draped in the usual sterile fashion. A Foley catheter was introduced into the bladder to monitor urinary output. Patient did receive 2 g of Cefotan preoperatively. She also had PAS stockings for DVT prophylaxis. Initially she was in the low lithotomy position. After the drapes were placed. It was decided to proceed with entry into the peritoneal cavity in the left upper quadrant because of the large midline scar extending from the symphysis pubis to the umbilicus in the event the underlying adhered bowel. Patient had an oral gastric tube to decompress her stomach. Entry into the peritoneal cavity was accomplished at mid-level of the lowest rib on the left 3 cm below that. A 5 mm trocar was  introduced. A pneumoperitoneum was established. Visualization demonstrated large amounts of omentum adhered to the anterior abdominal wall. A second 5 mm port was made on the contralateral side under laparoscopic guidance. And with the use of the Harmonic scalpel the omentum that was adhered to the underlying abdominal wall was coaptated and transected to allow visualization of the pelvic cavity. The omentum that was adhered to the uterine fundus and pelvic sidewall required meticulous dissection as well with the Harmonic scalpel to coaptated and transect. Once visualization of the pelvic cavity was evident the right fallopian tube was placed under tension and the mesial salpinx was coaptated and transected to the level of the tubal uterine junction and transected and placed in the cul-de-sac similar procedure was carried out on the contralateral side. The right smooth-appearing 2 cm ovarian cyst was incised with the Harmonic scalpel and clear fluid was evident and decompressed. Of note a third incision had previously been made under laparoscopic guidance which was a 10/11 mm trocar were by both fallopian tubes were retrieved and passed off for pathological evaluation. The pelvic cavity was copiously irrigated with normal saline solution. After ascertaining adequate hemostasis and pneumoperitoneum was removed and the laparoscopic trochars were removed. The subumbilical fascia was reapproximated with a figure-of-eight of 0 Vicryl suture the subcutaneous tissue was reapproximated with 3-0 Vicryl suture. All 3 port site skin incisions were reapproximated with Dermabond glue. 0.25% Marcaine was infiltrated all 3 port sites for postoperative analgesia for a total of 20 cc. Dressings were then placed. Attention was then placed to the hysteroscopic portion of the operation as follows:  The patient was then placed in a high lithotomy position.A weighted speculum was placed in the posterior vaginal vault. A single-tooth  tenaculum was placed on the anterior cervical lip. Pitressin was infiltrated  to cervical vaginal stroma at the 2, 4, 8, and 10:00 position for a total of 20 cc. (Dilution was 20 units of Pitressin and 30 units of saline). The uterus sounded to 10 cm. Pratt dilator were used to dilate the cervical canal to a 19 mm size. The Hologic Myosure resectoscopic morcellator with a scope of 6  mm and an operating blade of 4.5 mm was introduced into the intrauterine cavity. 0.9% normal saline was the distending media. Inspection of the endometrial cavity demonstrated multiple small uterine polyps located throughout the uterine cavity. With the resectoscopic morcellator they were removed and submitted for histological evaluation. Fluid deficit was approximately 375 cc.The single-tooth tenaculum was removed patient was extubated transferred to recovery room stable vital signs blood loss was minimal. She received 30 mg of Toradol in route to the recovery room.    ESTIMATED BLOOD LOSS: Minimal   Intake/Output Summary (Last 24 hours) at 05/28/14 1304 Last data filed at 05/28/14 1247  Gross per 24 hour  Intake   1300 ml  Output    350 ml  Net    950 ml     BLOOD ADMINISTERED:none   LOCAL MEDICATIONS USED:  MARCAINE   0.25 % 3 incision port sites for a total of 20 cc. Also Pitressin 20 units in 30 cc of normal saline was infiltrated at the cervical stroma at the 2, 4, 8, and 10:00 position for a total 10 cc.  SPECIMEN:  Source of Specimen:  #1 bilateral fallopian tubes #2 endometrial polyps  DISPOSITION OF SPECIMEN:  PATHOLOGY  COUNTS:  YES  PLAN OF CARE: Transfer to PACU  [facility] HospitalFERNANDEZ,Shaughnessy Gethers HMD1:04 PMTD@  .

## 2014-05-28 NOTE — Interval H&P Note (Signed)
History and Physical Interval Note:  05/28/2014 10:31 AM  Sheila Foster  has presented today for surgery, with the diagnosis of right ovarian cyst, desire for sterilization,metromenorrhagia, endometrial polyps  The various methods of treatment have been discussed with the patient and family. After consideration of risks, benefits and other options for treatment, the patient has consented to  Procedure(s): LAPAROSCOPIC RIGHT OVARIAN CYSTECTOMY (Right) BILATERAL SALPINGECTOMY (Bilateral) DILATATION & CURETTAGE/HYSTEROSCOPY WITH MYOSURE (N/A) as a surgical intervention .  The patient's history has been reviewed, patient examined, no change in status, stable for surgery.  I have reviewed the patient's chart and labs.  Questions were answered to the patient's satisfaction.     Ok EdwardsFERNANDEZ,JUAN H

## 2014-05-28 NOTE — H&P (View-Only) (Signed)
Sheila Foster is an 39 y.o. female. For preoperative examination as well as to discuss for follow-up ultrasound. Her history is as follows:  Patient is a 39 year old gravida 3 para 3 (first pregnancy resulting normal spontaneous vaginal delivery, second pregnancy resulting in cesarean section, third pregnancy July of this year vaginal birth after C-section) who presented to the office today with dysfunctional uterine bleeding since birth of her child. She stated that she breast-fed for one month.She did have her six-week postpartum visit. The only result I have on record is her hemoglobin was 9.7. She was supposed to be taking iron supplementation. Patient denies any visual disturbances any unusual headache or any nipple discharge.  Patient since then had been placed on iron supplementation 1 tablet twice a day her most recent CBC on February 22 demonstrated her hemoglobin 10.6 and hematocrit 35.6 with a platelet count of 273,000. She also had a normal comprehensive metabolic panel in January this year. Since the office visit of 04/17/2014 should was placed on Megace 40 mg twice a day and she's currently not bleeding today.  Her endometrial biopsy done on January 11 demonstrated the following: Diagnosis Endometrium, biopsy - INTERVAL PHASE ENDOMETRIUM (LATE PROLIFERATIVE/EARLY SECRETORY). - THERE IS NO EVIDENCE OF HYPERPLASIA OR MALIGNANCY.  Ultrasound/sono hysterogram done on the last office visit 04/17/2014 demonstrated the following: Uterus measured 10.8 x 7.3 x 5.4 cm with an endometrial stripe of 30.4 mm.A right thin wall echo-free avascular cyst measuring 54 x 31 x 46 mm was noted average size 4.4 cm. Left ovary was normal. The cervix was then cleansed with Betadine solution and a sterile catheter was introduced into the uterine cavity irregular endometrial wall numerous defects consistent with possible endometrial polyps as follows 20 x 10 mm, 16 x 10 mm, 15 x 12 mm and uterine synechia  was noted.  Her ultrasound today demonstrated the following: Uterus measured 10.9 x 7.9 x 6.7 cm with endometrial stripe of 48.8 mm. Prominent endometrial cavity with positive vascular flow was noted. Right ovary previous large cyst no longer present she had a small echo-free follicle measuring 23 x 15 mm. Left ovary was normal. No fluid in the cul-de-sac in no apparent adnexal masses. Patient has complaining on and off the past few days a left lower quadrant discomfort but was in no acute distress today.  Patient is being scheduled to undergo diagnostic laparoscopy with bilateral salpingectomy as part of her sterilization request as well as resectoscopic polypectomy for her this onto uterine bleeding contributing to her anemia.     Pertinent Gynecological History: Menses: dysfuctional Bleeding: dysfunctional uterine bleeding Contraception: abstinence DES exposure: denies Blood transfusions: none Sexually transmitted diseases: no past history Previous GYN Procedures:  2 NSVD one C section  Last mammogram: normal Date: not indicated Last pap: normal Date: 2016 OB History: G3, P3   Menstrual History: Menarche age: 50  Patient's last menstrual period was 04/17/2014 (approximate).    Past Medical History  Diagnosis Date  . Constipation   . Shingles 2010  . Contraception     condoms   . Cholelithiasis affecting pregnancy in third trimester, antepartum   . Endometritis July 2015  . SVD (spontaneous vaginal delivery) 1998, 2015    x 2  . Anemia     Past Surgical History  Procedure Laterality Date  . Cesarean section  2004    x1  . Wisdom tooth extraction      Family History  Problem Relation Age of Onset  . Heart disease  Father     MI age 37  . Hypertension Father   . Diabetes Father   . Colon cancer Neg Hx   . Breast cancer Neg Hx     Social History:  reports that she has never smoked. She has never used smokeless tobacco. She reports that she does not drink alcohol  or use illicit drugs.  Allergies: No Known Allergies   (Not in a hospital admission)  REVIEW OF SYSTEMS: A ROS was performed and pertinent positives and negatives are included in the history.  GENERAL: No fevers or chills. HEENT: No change in vision, no earache, sore throat or sinus congestion. NECK: No pain or stiffness. CARDIOVASCULAR: No chest pain or pressure. No palpitations. PULMONARY: No shortness of breath, cough or wheeze. GASTROINTESTINAL: No abdominal pain, nausea, vomiting or diarrhea, melena or bright red blood per rectum. GENITOURINARY: No urinary frequency, urgency, hesitancy or dysuria. MUSCULOSKELETAL: No joint or muscle pain, no back pain, no recent trauma. DERMATOLOGIC: No rash, no itching, no lesions. ENDOCRINE: No polyuria, polydipsia, no heat or cold intolerance. No recent change in weight. HEMATOLOGICAL: No anemia or easy bruising or bleeding. NEUROLOGIC: No headache, seizures, numbness, tingling or weakness. PSYCHIATRIC: No depression, no loss of interest in normal activity or change in sleep pattern.     Blood pressure 116/72, last menstrual period 04/17/2014, not currently breastfeeding.  Physical Exam:  HEENT:unremarkable Neck:Supple, midline, no thyroid megaly, no carotid bruits Lungs:  Clear to auscultation no rhonchi's or wheezes Heart:Regular rate and rhythm, no murmurs or gallops Breast Exam: Both breasts were examined sitting supine position there were symmetrical in appearance no skin discoloration or nipple inversion no palpable masses or tenderness no supraclavicular axillary lymphadenopathy Abdomen: Soft nontender no rebound or guarding midline scar from previous cesarean section was noted Pelvic:BUS within normal limits Vagina: No lesions or discharge Cervix: No lesions or discharge Uterus: Anteverted normal size shape and consistency Adnexa: No masses or tenderness Extremities: No cords, no edema Rectal: Not done   Assessment/Plan: Patient with  dysfunctional uterine bleeding attributed to endometrial polyps were noted on sonohysterogram. Normal endometrial biopsy recently. Patient currently on Megace 40 mg twice a day as well as iron supplementation 1 by mouth twice a day. Currently not bleeding today. Patient's hemoglobin has improved from 6.5 g on 04/08/2014 up to 10.6 on 05/20/2014. Patient scheduled to undergo resectoscopic polypectomy along with laparoscopic bilateral salpingectomy. Patient is fully aware that after this operation she will no longer be able to have any children. This is her request to undergo sterilization as well under 1 anesthesia. Additional risks discussed with patient are as follows:                        Patient was counseled as to the risk of surgery to include the following:  1. Infection (prohylactic antibiotics will be administered)  2. DVT/Pulmonary Embolism (prophylactic pneumo compression stockings will be used)  3.Trauma to internal organs requiring additional surgical procedure to repair any injury to     Internal organs requiring perhaps additional hospitalization days.  4.Hemmorhage requiring transfusion and blood products which carry risks such as             anaphylactic reaction, hepatitis and AIDS  Patient had received literature information on the procedure scheduled and all her questions were answered and fully accepts all risk.   [facility] SurgeriesFERNANDEZ,Allean Montfort HMD9:57 AMTD@Note :     Ok EdwardsFERNANDEZ,Alaila Pillard H 05/22/2014, 9:21 AM

## 2014-05-28 NOTE — Transfer of Care (Signed)
Immediate Anesthesia Transfer of Care Note  Patient: Sheila Foster  Procedure(s) Performed: Procedure(s): LAPAROSCOPIC RIGHT OVARIAN CYSTECTOMY (Right) BILATERAL SALPINGECTOMY (Bilateral) DILATATION & CURETTAGE/HYSTEROSCOPY WITH MYOSURE (N/A) LAPAROSCOPIC LYSIS OF ADHESIONS (N/A)  Patient Location: PACU  Anesthesia Type:General  Level of Consciousness: awake and sedated  Airway & Oxygen Therapy: Patient Spontanous Breathing and Patient connected to nasal cannula oxygen  Post-op Assessment: Report given to RN and Post -op Vital signs reviewed and stable  Post vital signs: Reviewed and stable  Last Vitals:  Filed Vitals:   05/28/14 0838  BP: 101/67  Pulse: 65  Temp: 36.7 C  Resp: 18    Complications: No apparent anesthesia complications

## 2014-05-28 NOTE — Anesthesia Preprocedure Evaluation (Signed)
Anesthesia Evaluation  Patient identified by MRN, date of birth, ID band Patient awake    Reviewed: Allergy & Precautions, H&P , NPO status , Patient's Chart, lab work & pertinent test results  Airway Mallampati: I  TM Distance: >3 FB Neck ROM: full    Dental no notable dental hx. (+) Teeth Intact   Pulmonary neg pulmonary ROS,    Pulmonary exam normal       Cardiovascular negative cardio ROS      Neuro/Psych    GI/Hepatic Neg liver ROS, GERD-  Medicated and Controlled,  Endo/Other  negative endocrine ROS  Renal/GU negative Renal ROS     Musculoskeletal   Abdominal Normal abdominal exam  (+)   Peds  Hematology negative hematology ROS (+)   Anesthesia Other Findings   Reproductive/Obstetrics negative OB ROS                             Anesthesia Physical Anesthesia Plan  ASA: II  Anesthesia Plan: General   Post-op Pain Management:    Induction: Intravenous  Airway Management Planned: Oral ETT  Additional Equipment:   Intra-op Plan:   Post-operative Plan: Extubation in OR  Informed Consent: I have reviewed the patients History and Physical, chart, labs and discussed the procedure including the risks, benefits and alternatives for the proposed anesthesia with the patient or authorized representative who has indicated his/her understanding and acceptance.   Dental Advisory Given  Plan Discussed with: CRNA and Surgeon  Anesthesia Plan Comments:         Anesthesia Quick Evaluation

## 2014-05-28 NOTE — Anesthesia Postprocedure Evaluation (Signed)
  Anesthesia Post-op Note  Patient: Sheila Foster  Procedure(s) Performed: Procedure(s) (LRB): LAPAROSCOPIC RIGHT OVARIAN CYSTECTOMY (Right) BILATERAL SALPINGECTOMY (Bilateral) DILATATION & CURETTAGE/HYSTEROSCOPY WITH MYOSURE (N/A) LAPAROSCOPIC LYSIS OF ADHESIONS (N/A)  Patient Location: PACU  Anesthesia Type: General  Level of Consciousness: awake and alert   Airway and Oxygen Therapy: Patient Spontanous Breathing  Post-op Pain: mild  Post-op Assessment: Post-op Vital signs reviewed, Patient's Cardiovascular Status Stable, Respiratory Function Stable, Patent Airway and No signs of Nausea or vomiting  Last Vitals:  Filed Vitals:   05/28/14 0838  BP: 101/67  Pulse: 65  Temp: 36.7 C  Resp: 18    Post-op Vital Signs: stable   Complications: No apparent anesthesia complications

## 2014-05-29 ENCOUNTER — Encounter (HOSPITAL_COMMUNITY): Payer: Self-pay | Admitting: Gynecology

## 2014-06-03 ENCOUNTER — Ambulatory Visit (INDEPENDENT_AMBULATORY_CARE_PROVIDER_SITE_OTHER): Payer: BLUE CROSS/BLUE SHIELD | Admitting: Women's Health

## 2014-06-03 ENCOUNTER — Encounter: Payer: Self-pay | Admitting: Women's Health

## 2014-06-03 ENCOUNTER — Ambulatory Visit: Payer: BLUE CROSS/BLUE SHIELD | Admitting: Podiatry

## 2014-06-03 VITALS — BP 120/70

## 2014-06-03 DIAGNOSIS — R319 Hematuria, unspecified: Secondary | ICD-10-CM | POA: Diagnosis not present

## 2014-06-03 DIAGNOSIS — Z8742 Personal history of other diseases of the female genital tract: Secondary | ICD-10-CM

## 2014-06-03 DIAGNOSIS — R0981 Nasal congestion: Secondary | ICD-10-CM

## 2014-06-03 LAB — URINALYSIS W MICROSCOPIC + REFLEX CULTURE
Bilirubin Urine: NEGATIVE
Casts: NONE SEEN
Crystals: NONE SEEN
Glucose, UA: NEGATIVE mg/dL
Ketones, ur: NEGATIVE mg/dL
Nitrite: NEGATIVE
PROTEIN: 30 mg/dL — AB
Specific Gravity, Urine: 1.015 (ref 1.005–1.030)
UROBILINOGEN UA: 0.2 mg/dL (ref 0.0–1.0)
pH: 7 (ref 5.0–8.0)

## 2014-06-03 NOTE — Patient Instructions (Addendum)
Robitussin   OTC  Take for congestion Magic mouthwash gargle and spit out Ibuprofen; Pseudoephedrine tablets or caplets Qu es este medicamento? El EldonBUPROFENO; SEUDOEFEDRINA es una combinacin de un medicamento antiinflamatorio no esteroideo (AINE) y Development worker, communitydescongestionante. Se [place] ([place]) Islandsutiliza para tratar las [address]molestias y dolores, la congestin, la fiebre asociada con resfros comunes, gripe u otros problemas sinusales. Este medicamento puede ser utilizado para otros usos; si tiene alguna pregunta consulte con su proveedor de atencin mdica o con su farmacutico. MARCAS COMERCIALES DISPONIBLES: Advil Cold and Sinus, Advil Flu and Body Ache, Dristan Sinus, Motrin Cold and Sinus, Motrin Sinus Headache Qu le debo informar a mi profesional de la salud antes de tomar este medicamento? Necesita saber si usted presenta alguno de los siguientes problemas o situaciones: -problemas de sangrado -diabetes -enfermedad o ciruga cardiaca -alta presin sangunea -enfermedad renal -no toma lquidos -vmito o diarrea severa -lceras estomacales u otros problemas estomacales -si ha tomado un IMAO, tales como Glen Whitearbex, Eldepryl, Marplan, Nardil o Parnate en los ltimos 14 das -enfermedad tiroidea -una reaccin alrgica o inusual al ibuprofeno, la seudoefedrina, otros medicamentos que bajan la fiebre o Associate Professoralivian el dolor, a otros medicamentos, alimentos, Software engineercolorantes o conservantes -si est embarazada o buscando quedar embarazada -si est amamantando a un beb Cmo debo Visual merchandiserutilizar este medicamento? Tome este medicamento por va oral con un vaso de agua. Siga las instrucciones de la etiqueta del Martinmedicamento. Puede tomarlo con o sin alimentos. Si este medicamento le produce Programme researcher, broadcasting/film/videomalestar estomacal, tmelo con alimentos. Trate de no acostarse por lo menos 10 minutos despus de tomarlo. Tome sus dosis a intervalos regulares. No tome su medicamento con una frecuencia mayor a la indicada. Hable con su pediatra para informarse acerca del  uso de este medicamento en nios. Aunque este medicamento ha sido recetado a nios tan menores como de 12 aos de edad para condiciones selectivas, las precauciones se aplican. Los pacientes de ms de 65 aos de edad pueden presentar reacciones ms fuertes y Pension scheme managernecesitar dosis Liberty Globalmenores. Sobredosis: Pngase en contacto inmediatamente con un centro toxicolgico o una sala de urgencia si usted cree que haya tomado demasiado medicamento. ATENCIN: Reynolds AmericanEste medicamento es solo para usted. No comparta este medicamento con nadie. Qu sucede si me olvido de una dosis? Si olvida una dosis, tmela lo antes posible. Si es casi la hora de la prxima dosis, tome slo esa dosis. No tome dosis adicionales o dobles. Qu puede interactuar con este medicamento? No tome esta medicina con ninguno de los siguientes medicamentos: -bromocriptina -cidofovir -cocana -alcaloides ergotnicos tales como, dihidroergotamina, ergonovina, ergotamina, metilergonovina -IMAOs, tales como Carbex, Eldepryl, Marplan, Nardil y Parnate -metotrexato -pemetrexed -medicamentos estimulantes para los trastornos de Visual merchandiseratencin, perder peso o United Technologies Corporationmantenerse despierto Esta medicina tambin puede interactuar con los siguientes medicamentos: -alcohol -alendronato -aspirina o medicamentos tipo aspirina -ciclopropano -furazolidona -linezolid -ginkgo -mecamilamina -medicamentos para problemas de la vejiga -medicamentos para la presin sangunea, enfermedad cardiaca, ritmo cardiaco irregular -medicamentos para el dolor en el pecho, tales como digoxina, nifedipina, verapamilo -medicamentos para la depresin, ansiedad o trastornos psicticos -medicamentos para la prstata -medicamentos para inducir el sueo durante una ciruga -medicamentos que tratan o previenen cogulos sanguneos, como la warfarina, enoxaparina y dalteparina -otros medicamentos para la congestin, fiebre, Ecologistinflamacin, Engineer, miningdolor -procarbazina -reserpina -medicamentos esteroideos,  como la prednisona o la cortisona -hierba de [place] Puede ser que esta lista no menciona todas las posibles interacciones. Informe a su profesional de Beazer Homesla salud de [employer] Inctodos los productos a base de hierbas, medicamentos de venta libre o suplementos nutritivos que est  tomando. Si usted fuma, consume bebidas alcohlicas o si utiliza drogas ilegales, indqueselo tambin a su profesional de [employer]. Algunas sustancias pueden interactuar con su medicamento. A qu debo estar atento al usar [employer]? Consulte a su mdico o su profesional de la salud si sus sntomas no comienzan a mejorar o si empeoran. Adems, si la fiebre, dolor o congestin nasal empeora o dura ms de 3 das, comunquese con su mdico. Si este medicamento no lo deja dormir de noche, trate de tomar la dosis ms temprano Programmer, multimedia. Si sigue teniendo dificultad en dormir, deje de Producer, television/film/video y consulte a su mdico. Qu efectos secundarios puedo tener al Chemical engineer este medicamento? Efectos secundarios que debe informar a su mdico o a Producer, television/film/video de la salud tan pronto como sea posible: -Therapist, art como erupcin cutnea, picazn o urticarias, hinchazn de la cara, labios o lengua -pulso cardiaco rpido, irregular -sensacin de desmayos o aturdimiento, cadas -alucinaciones -alta presin sangunea -dolor, hormigueo, entumecimiento de las manos o pies -dolor de Gaffer -signos y sntomas de sangrado tales como heces de color oscuro, con sangre o con aspecto alquitranado; orina roja o marrn oscura; escupir sangre o material marrn que parece granos de caf; puntos rojos en la piel; sangrado o magulladuras inusuales de los ojos, encas o Clinical cytogeneticist -signos y sntomas de un cogulo sanguneo tales como cambios en la visin; Journalist, newspaper; dolor de Animator, repentino; dificultad para hablar; entumecimiento o debilidad repentina de la cara, brazo o pierna -dificultad para orinar o cambios en el volumen  de orina -aumento de peso o hinchazn sin explicacin -cansancio o debilidad inusual -color amarillento de los ojos o la piel Efectos secundarios que, por lo general, no requieren Psychologist, prison and probation services (debe informarlos a su mdico o a Producer, television/film/video de la salud si persisten o si son molestos): -ansioso -magulladuras -estreimiento o diarrea -dolor de cabeza -prdida del apetito -nuseas, vmito -dificultad para conciliar el sueo Puede ser que esta lista no menciona todos los posibles efectos secundarios. Comunquese a su mdico por asesoramiento mdico [employer]. Usted puede informar los efectos secundarios a la FDA por telfono al 1-800-FDA-1088. Dnde debo guardar mi medicina? Mantngala fuera del alcance de los nios. Gurdela a [employer], entre 20 y 25 grados C (35 y 66 grados F). Deseche todo el medicamento que no haya utilizado, despus de la fecha de vencimiento. ATENCIN: Este folleto es un resumen. Puede ser que no cubra toda la posible informacin. Si usted tiene preguntas acerca de esta medicina, consulte con su mdico, su farmacutico o su profesional de Radiographer, therapeutic.  2015, Elsevier/Gold Standard. (2012-11-29 11:45:40)

## 2014-06-03 NOTE — Progress Notes (Signed)
Patient ID: Sheila Foster, female   DOB: 08/20/1975, 39 y.o.   MRN: [id number] Presents with several complaints. 05/28/2014 benign endometrial polyp and bilateral salpingectomy per Dr. Lily PeerFernandez. Since surgery has had a sore throat, ear fullness, head congestion, low abdominal pain. Menses started 05/31/2014, heavy flow. Denies vaginal odor or irritation, urinary symptoms, fever, productive cough or nasal drainage or nausea. Has 3 children at home youngest is 7 months, husband has been caring for children. Reports urinating without difficulty and had a bowel movement yesterday.  Exam: Appears uncomfortable, throat mildly erythematous, tongue coated, no lymphadenopathy, ears both clear, lungs clear throughout, bowel sounds present in all 4 quadrants. Laparoscopic incisions well approximated, non-erythematous with no drainage. area cleansed with alcohol and  Band-Aids reapplied. External genitalia within normal limits, speculum exam copious menses type blood noted. UA: Large blood, trace leukocytes, 0-2 WBCs, TNTC RBCs, few bacteria.  Head congestion with sore throat after surgery Well-healing laparoscopic surgery Dysmenorrhea with menses  Plan: Magic mouthwash with lidocaine, 4 times a day, instructions, prescription given and reviewed. Encouraged over-the-counter Robitussin, Advil sinus and cold, increase rest and fluids. Instructed to call if no relief, keep scheduled follow-up with Dr. Lily PeerFernandez. Urine culture pending.

## 2014-06-03 NOTE — Addendum Note (Signed)
Addended by: Dayna BarkerGARDNER, Detria Cummings K on: 06/03/2014 02:01 PM   Modules accepted: Orders

## 2014-06-04 LAB — URINE CULTURE: Colony Count: 50000

## 2014-06-05 ENCOUNTER — Ambulatory Visit: Payer: BLUE CROSS/BLUE SHIELD | Admitting: Podiatry

## 2014-06-07 ENCOUNTER — Encounter: Payer: Self-pay | Admitting: Gynecology

## 2014-06-07 ENCOUNTER — Ambulatory Visit (INDEPENDENT_AMBULATORY_CARE_PROVIDER_SITE_OTHER): Payer: BLUE CROSS/BLUE SHIELD | Admitting: Gynecology

## 2014-06-07 VITALS — BP 126/84

## 2014-06-07 DIAGNOSIS — Z09 Encounter for follow-up examination after completed treatment for conditions other than malignant neoplasm: Secondary | ICD-10-CM

## 2014-06-07 DIAGNOSIS — N719 Inflammatory disease of uterus, unspecified: Secondary | ICD-10-CM

## 2014-06-07 DIAGNOSIS — R1084 Generalized abdominal pain: Secondary | ICD-10-CM | POA: Diagnosis not present

## 2014-06-07 MED ORDER — DOXYCYCLINE HYCLATE 100 MG PO CAPS: 100.0000 mg | ORAL_CAPSULE | Freq: Two times a day (BID) | ORAL | Status: DC

## 2014-06-07 MED ORDER — KETOROLAC TROMETHAMINE 30 MG/ML IJ SOLN
30.0000 mg | Freq: Once | INTRAMUSCULAR | Status: AC
  Administered 2014-06-07: 30 mg via INTRAMUSCULAR

## 2014-06-07 MED ORDER — KETOROLAC TROMETHAMINE 10 MG PO TABS: 10.0000 mg | ORAL_TABLET | Freq: Four times a day (QID) | ORAL | Status: DC | PRN

## 2014-06-07 NOTE — Progress Notes (Signed)
   Patient presented to the office today a few days earlier than scheduled for her postop visit. Patient's complain some abdominal cramping 2 days ago and passage of a large blood clot and was concerned. Patient denied any GU or GI complaints. Patient currently taking iron supplementation. Preoperative hemoglobin was 10.6.  On 06/16/2014 patient underwent laparoscopic bilateral salpingectomy and aspiration of right ovarian cyst along with lysis of abdominal pelvic adhesions (extensive) along with resectoscopic polypectomy. Intraoperative findings as well as pathology report which share with the patient as well as pictures as follows:  FINDINGS: Patient had extensive abdominal pelvic adhesions from previous midline scar/cesarean section. Simple appearing 2 cm right ovarian cyst left ovary was normal. Normal fallopian tubes. Omentum adhered to the fundal aspect of the uterus., Cul-de-sac free of adhesions and endometriotic implants. Smooth gallbladder and liver surface seen. Appendix not visualized  Diagnosis 1. Fallopian tube, bilateral - UNREMARKABLE FALLOPIAN TUBES. 2. Endometrial polyp - SECRETORY ENDOMETRIUM WITH DIFUSE PROGESTATIONAL CHANGES AND BENIGN ENDOMETRIAL POLYP. - NO HYPERPLASIA OR CARCINOMA.  Exam: Blood pressure 126/84 Back: No CVA tenderness Abdomen: Incisions port completely healed. Patient slightly tender suprapubically Pelvic: Bartholin urethra Skene was within normal limits Vagina: Some menstrual blood was present Uterus: Anteverted slightly tender fundal region Adnexa: Mildly tender no rebound or guarding no palpable masses Rectal exam: Not done  Assessment/plan: Patient 10 days status post laparoscopic bilateral salpingectomy lysis of abdominal pelvic adhesions and resectoscopic polypectomy was slightly tender uterus will be treated for suspected mild endometritis with Vibramycin 100 mg one by mouth twice a day for 7 days. She will finish off her Megace 40 mg twice a day  that she had the will last her for 5 more days to control the bleeding. She was given Toradol 30 mg IM today and prescription for a 10 mg tablet to take 1 by mouth every 6 hours when necessary for the next 5 days. She was instructed to continue to take her iron tablet one by mouth daily. She will return back to the office in 2 weeks for postop visit. We will check her CBC at the next visit.

## 2014-06-07 NOTE — Addendum Note (Signed)
Addended by: Berna SpareASTILLO, Zakee Deerman A on: 06/07/2014 04:26 PM   Modules accepted: Orders

## 2014-06-12 ENCOUNTER — Ambulatory Visit: Payer: BLUE CROSS/BLUE SHIELD | Admitting: Gynecology

## 2014-06-18 ENCOUNTER — Encounter: Payer: Self-pay | Admitting: Gynecology

## 2014-06-18 ENCOUNTER — Ambulatory Visit (INDEPENDENT_AMBULATORY_CARE_PROVIDER_SITE_OTHER): Payer: BLUE CROSS/BLUE SHIELD | Admitting: Gynecology

## 2014-06-18 VITALS — BP 114/76

## 2014-06-18 DIAGNOSIS — D500 Iron deficiency anemia secondary to blood loss (chronic): Secondary | ICD-10-CM

## 2014-06-18 DIAGNOSIS — Z09 Encounter for follow-up examination after completed treatment for conditions other than malignant neoplasm: Secondary | ICD-10-CM

## 2014-06-18 DIAGNOSIS — R300 Dysuria: Secondary | ICD-10-CM

## 2014-06-18 DIAGNOSIS — N938 Other specified abnormal uterine and vaginal bleeding: Secondary | ICD-10-CM

## 2014-06-18 LAB — URINALYSIS W MICROSCOPIC + REFLEX CULTURE
BILIRUBIN URINE: NEGATIVE
Casts: NONE SEEN
Crystals: NONE SEEN
Glucose, UA: NEGATIVE mg/dL
Ketones, ur: NEGATIVE mg/dL
Leukocytes, UA: NEGATIVE
Nitrite: NEGATIVE
Specific Gravity, Urine: 1.02 (ref 1.005–1.030)
Urobilinogen, UA: 0.2 mg/dL (ref 0.0–1.0)
pH: 7 (ref 5.0–8.0)

## 2014-06-18 MED ORDER — PHENAZOPYRIDINE HCL 200 MG PO TABS: 200.0000 mg | ORAL_TABLET | Freq: Three times a day (TID) | ORAL | Status: DC | PRN

## 2014-06-18 MED ORDER — LEVONORGESTREL-ETHINYL ESTRAD 0.1-20 MG-MCG PO TABS: ORAL_TABLET | ORAL | Status: DC

## 2014-06-18 NOTE — Patient Instructions (Signed)
Uso de los anticonceptivos orales (Oral Contraception Use) Los anticonceptivos orales (ACO) son medicamentos que se utilizan para evitar el embarazo. Su funcin es evitar que los ovarios liberen vulos. Las hormonas de los ACO tambin hacen que el moco cervical se haga ms espeso, lo que evita que el esperma ingrese al tero. Tambin hacen que la membrana que recubre internamente al tero se vuelva ms fina, lo que no permite que el huevo fertilizado se adhiera a la pared del tero. Los ACO son muy efectivos cuando se toman exactamente como se prescriben. Sin embargo, no previenen contra las enfermedades de transmisin sexual (ETS). La prctica del sexo seguro, como el uso de preservativos, junto con los ACO, ayudan a prevenir ese tipo de enfermedades. Antes de tomar ACO, debe hacerse un examen fsico y un Papanicolau. El mdico podr indicarle anlisis de sangre, si es necesario. El mdico se asegurar de que usted sea una buena candidata para usar anticonceptivos orales. Converse con su mdico acerca de los posibles efectos secundarios de los ACO que podran recetarle. Cuando se inicia el uso de ACO, se pueden tomar durante 2 a 3 meses para que el cuerpo se adapte a los cambios en los niveles hormonales en el cuerpo.  CMO TOMAR LOS ANTICONCEPTIVOS ORALES El mdico le indicar como comenzar a tomar el primer ciclo de ACO. De lo contrario usted puede:   Comenzar el da de inicio del ciclo menstrual. No necesitar proteccin anticonceptiva adicional al comenzar en este momento.   Comenzar el primer domingo luego de su perodo menstrual, o el da en que adquiere el medicamento. En estos casos deber tener proteccin anticonceptiva adicional durante los primeros 7 das del ciclo.   Comenzar a tomarlos en cualquier momento del ciclo. Si toma el anticonceptivo dentro de los 5 das de iniciado el perodo, estar protegida de quedar embarazada inmediatamente. En este caso, no necesitar una forma adicional de  anticonceptivos. Si comienza en cualquier otro momento del ciclo menstrual, necesitar usar otra forma de anticonceptivo durante 7 das. Si sus ACO son del tipo de los llamados minipldoras, podrn impedir el embarazo despus de tomarlas por 2 das (48 horas). Luego de comenzar a tomar los ACO:   Si olvid de tomar 1 pldora, tmela tan pronto como lo recuerde. Tome la siguiente pldora a la hora habitual.   Si dej de tomar 2 o ms pldoras, comunquese con su mdico ya que diferentes pldoras tienen diferentes instrucciones para las dosis que no se han tomado. Si olvida tomar 2 o ms pldoras, utilice un mtodo anticonceptivo adicional hasta que comience su prximo perodo menstrual.   Si utiliza el envase de 28 pldoras que contienen pldoras inactivas y olvida tomar 1 de las ltimas 7 (pldoras sin hormonas), sto no tiene importancia. Simplemente deseche el resto de las pldoras que no contienen hormonas y comience un nuevo envase.  No importa cuando comience a tomar los anticonceptivos, siempre empiece un nuevo envase el mismo da de la semana. Tenga un envase extra de ACO y use un mtodo anticonceptivo adicional para el caso en que se olvide de tomar algunas pldoras o pierda la caja.  INSTRUCCIONES PARA EL CUIDADO EN EL HOGAR   No fume.   Use siempre un condn para protegerse contra las enfermedades de transmisin sexual. Los ACO no protegen contra las enfermedades de transmisin sexual.   Use un almanaque para marcar los das de su perodo menstrual.   Lea la informacin y consejos que vienen con las ACO. Hable con   el profesional si tiene dudas.  SOLICITE ATENCIN MDICA SI:   Presenta nuseas o vmitos.   Tiene flujo o sangrado vaginal anormal.   Aparece una erupcin cutnea.   No tiene el perodo menstrual.   Pierde el cabello.   Necesita tratamiento por cambios en su estado de nimo o por depresin.   Se siente mareada al tomar los ACO.   Comienza a  aparecer acn con el uso de los ACO.   Queda embarazada.  SOLICITE ATENCIN MDICA DE INMEDIATO SI:   Siente dolor en el pecho.   Le falta el aire.   Le duele mucho la cabeza y no puede controlar el dolor.   Siente adormecimiento o tiene dificultad para hablar.   Tiene problemas de visin.   Presenta dolor, inflamacin o hinchazn en las piernas.  Document Released: 03/04/2011 Document Revised: 11/15/2012 ExitCare Patient Information 2015 [employer]. This information is not intended to replace advice given to you by your health care provider. Make sure you discuss any questions you have with your health care provider.  

## 2014-06-18 NOTE — Progress Notes (Signed)
   Patient presented today for her official first postop visit. She was seen in the office on March 11 complaints of abdominal cramping for 2 days along with passage of a large blood clot was concerned. She denied any GU or GI complaints. She is currently on iron supplementation 1 daily. Her preoperative hemoglobin was 12.6.  On 06/16/2014 patient underwent laparoscopic bilateral salpingectomy and aspiration of right ovarian cyst along with lysis of abdominal pelvic adhesions (extensive) along with resectoscopic polypectomy. Intraoperative findings as well as pathology report which share with the patient as well as pictures as follows:  FINDINGS: Patient had extensive abdominal pelvic adhesions from previous midline scar/cesarean section. Simple appearing 2 cm right ovarian cyst left ovary was normal. Normal fallopian tubes. Omentum adhered to the fundal aspect of the uterus., Cul-de-sac free of adhesions and endometriotic implants. Smooth gallbladder and liver surface seen. Appendix not visualized  Diagnosis 1. Fallopian tube, bilateral - UNREMARKABLE FALLOPIAN TUBES. 2. Endometrial polyp - SECRETORY ENDOMETRIUM WITH DIFUSE PROGESTATIONAL CHANGES AND BENIGN ENDOMETRIAL POLYP. - NO HYPERPLASIA OR CARCINOMA.  On that office visit of March 11 she was treated for suspected endometritis and placed on Vibramycin 100 mg twice a day for 7 days. She was instructed to finish off her Megace 40 mg that she took for a total 10 days. She was given a prescription for Toradol to take 1 by mouth every 6 hours when necessary for 5 days. Today she denies any dysuria or frequency some spotting still present at times and some suprapubic discomfort when voiding like she states she had before after her C-section.   Exam: Blood pressure 114/76 Gen. 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 in no apparent acute distress Abdomen abdomen incision ports intact, no suprapubic discomfort but no rebound or guarding Pelvic:  Bartholin urethra Skene was within normal limits Vagina: No gross lesions on inspection some old dried blood was present the vaginal vault Cervix: No active bleeding Uterus: Anteverted mildly tender suprapubically Adnexa: No palpable masses or tenderness Rectal exam: Not done   Urinalysis 0-2 WBC, 3-6 RBC Rare bacteria Culture pending  Assessment/plan:  Patient approximately 3 weeks ago underwent laparoscopic bilateral salpingectomy along with lysis of abdominal pelvic adhesions and resectoscopic polypectomy pathology as described above. Patient was treated recently for suspected endometritis. In an effort to help regulate her cycle she is going to be started on Alesse 39 year old contraceptive pill that I would like her to take continuously and withdrawal every 3 months. For her mild bladder spasm she will be prescribed Pyridium 200 mg to take 1 by mouth 3 times a day for 3 days. She will continue on her daily iron supplementation. We will check her CBC. She will return back to the office in 3 months for follow-up or when necessary. The risks benefits and pros and cons were contraceptive pill were discussed. She may resume full normal activity.

## 2014-06-19 ENCOUNTER — Other Ambulatory Visit: Payer: Self-pay | Admitting: Gynecology

## 2014-06-19 DIAGNOSIS — D509 Iron deficiency anemia, unspecified: Secondary | ICD-10-CM

## 2014-06-19 LAB — CBC WITH DIFFERENTIAL/PLATELET
Basophils Absolute: 0 10*3/uL (ref 0.0–0.1)
Basophils Relative: 0 % (ref 0–1)
Eosinophils Absolute: 0.1 10*3/uL (ref 0.0–0.7)
Eosinophils Relative: 1 % (ref 0–5)
HEMATOCRIT: 35.6 % — AB (ref 36.0–46.0)
HEMOGLOBIN: 10.7 g/dL — AB (ref 12.0–15.0)
LYMPHS ABS: 2.8 10*3/uL (ref 0.7–4.0)
LYMPHS PCT: 30 % (ref 12–46)
MCH: 21 pg — ABNORMAL LOW (ref 26.0–34.0)
MCHC: 30.1 g/dL (ref 30.0–36.0)
MCV: 69.9 fL — AB (ref 78.0–100.0)
MONO ABS: 0.7 10*3/uL (ref 0.1–1.0)
MONOS PCT: 7 % (ref 3–12)
NEUTROS ABS: 5.8 10*3/uL (ref 1.7–7.7)
NEUTROS PCT: 62 % (ref 43–77)
Platelets: 347 10*3/uL (ref 150–400)
RBC: 5.09 MIL/uL (ref 3.87–5.11)
RDW: 22.4 % — ABNORMAL HIGH (ref 11.5–15.5)
WBC: 9.4 10*3/uL (ref 4.0–10.5)

## 2014-06-20 LAB — URINE CULTURE: Colony Count: 40000

## 2014-07-03 ENCOUNTER — Ambulatory Visit (INDEPENDENT_AMBULATORY_CARE_PROVIDER_SITE_OTHER): Payer: BLUE CROSS/BLUE SHIELD | Admitting: Podiatry

## 2014-07-03 ENCOUNTER — Encounter: Payer: Self-pay | Admitting: Podiatry

## 2014-07-03 ENCOUNTER — Ambulatory Visit: Payer: BLUE CROSS/BLUE SHIELD

## 2014-07-03 ENCOUNTER — Other Ambulatory Visit: Payer: Self-pay

## 2014-07-03 VITALS — BP 117/69 | HR 76 | Resp 12

## 2014-07-03 DIAGNOSIS — M722 Plantar fascial fibromatosis: Secondary | ICD-10-CM | POA: Diagnosis not present

## 2014-07-03 DIAGNOSIS — R52 Pain, unspecified: Secondary | ICD-10-CM

## 2014-07-03 DIAGNOSIS — G629 Polyneuropathy, unspecified: Secondary | ICD-10-CM | POA: Diagnosis not present

## 2014-07-03 NOTE — Patient Instructions (Signed)
Make an appointment with your primary care doctor to have your sugar check. The burning your feet may be associated with your prediabetes   Bent - Knee Calf Stretch  1) Stand an arm's length away from a wall. Place the palms of your hands on the wall. Step forward about 12 inches with the opposite foot.  2) Keeping toes pointed forward and both heels on the floor, bend both knees and lean forward. Hold this position for 60 seconds. Don't arch your back and don't hunch your shoulders.  3) Repeat this twice.  DO THIS STRETCHING TECHNIQUE 3 TIMES A DAY.   Stretching Exercises before Standing  Pull your toes up toward your nose and hold for 1 minute before standing.  A towel can assist with this exercise if you put the towel under the ball of your foot. This exercise reduces the intense   pain associated when changing from a seated to a standing position. This stretch can usually be the most beneficial if done before getting out of bed in the mornings. Plantar Fasciitis Plantar fasciitis is a common condition that causes foot pain. It is soreness (inflammation) of the band of tough fibrous tissue on the bottom of the foot that runs from the heel bone (calcaneus) to the ball of the foot. The cause of this soreness may be from excessive standing, poor fitting shoes, running on hard surfaces, being overweight, having an abnormal walk, or overuse (this is common in runners) of the painful foot or feet. It is also common in aerobic exercise dancers and ballet dancers. SYMPTOMS  Most people with plantar fasciitis complain of:  Severe pain in the morning on the bottom of their foot especially when taking the first steps out of bed. This pain recedes after a few minutes of walking.  Severe pain is experienced also during walking following a long period of inactivity.  Pain is worse when walking barefoot or up stairs DIAGNOSIS   Your caregiver will diagnose this condition by examining and feeling  your foot.  Special tests such as X-rays of your foot, are usually not needed. PREVENTION   Consult a sports medicine professional before beginning a new exercise program.  Walking programs offer a good workout. With walking there is a lower chance of overuse injuries common to runners. There is less impact and less jarring of the joints.  Begin all new exercise programs slowly. If problems or pain develop, decrease the amount of time or distance until you are at a comfortable level.  Wear good shoes and replace them regularly.  Stretch your foot and the heel cords at the back of the ankle (Achilles tendon) both before and after exercise.  Run or exercise on even surfaces that are not hard. For example, asphalt is better than pavement.  Do not run barefoot on hard surfaces.  If using a treadmill, vary the incline.  Do not continue to workout if you have foot or joint problems. Seek professional help if they do not improve. HOME CARE INSTRUCTIONS   Avoid activities that cause you pain until you recover.  Use ice or cold packs on the problem or painful areas after working out.  Only take over-the-counter or prescription medicines for pain, discomfort, or fever as directed by your caregiver.  Soft shoe inserts or athletic shoes with air or gel sole cushions may be helpful.  If problems continue or become more severe, consult a sports medicine caregiver or your own health care provider. Cortisone is a  potent anti-inflammatory medication that may be injected into the painful area. You can discuss this treatment with your caregiver. MAKE SURE YOU:   Understand these instructions.  Will watch your condition.  Will get help right away if you are not doing well or get worse. Document Released: 12/08/2000 Document Revised: 06/07/2011 Document Reviewed: 02/07/2008 [facility] Patient Information 2015 [place], [place]. This information is not intended to replace advice given to you by your  health care provider. Make sure you discuss any questions you have with your health care provider.

## 2014-07-03 NOTE — Progress Notes (Signed)
   Subjective:    Patient ID: Sheila Foster, female    DOB: 09/12/1975, 39 y.o.   MRN: [id number]  HPI  N-SHARP PAIN, BURNING SENSATION L-RT FOOT 1ST TOE, BOTTOM HEEL D-8 MONTHS O-SLOWLY C-WORSE A-SITTING, ESPECIALLY AT NIGHT T-NONE  ALSO, GREAT TOENAIL HAVE DISCOLORATION .  Review of Systems  All other systems reviewed and are negative.  History of prediabetes  Patient was evaluated on September 29th 2014 for possible mycotic toenails infections. At that time patient had increased liver function and result there was a relative contraindication to using any terbinafine    Objective:   Physical Exam  Orientated 3  Vascular: DP pulses 2/4 bilaterally PT pulses 2/4 bilaterally  Neurological: Sensation to 10 g monofilament wire intact 5/5 bilaterally Vibratory sensation intact bilaterally Ankle reflex equal and reactive bilaterally  Dermatological: The toenails are brittle, hypertrophic, discolored 6-10  Musculoskeletal: HAV deformity left  Lab results reviewed in patient's chart dated 04/08/2014 Blood glucose 106 elevated Liver enzymes elevated       Assessment & Plan:   Assessment: 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 I recommended that she further discuss these symptoms with her primary care physician.  Again patient's liver enzymes appear to be elevated and I'm not recommending any terbinafine therapy at this time  Reappoint at patient's request

## 2014-07-08 ENCOUNTER — Encounter (INDEPENDENT_AMBULATORY_CARE_PROVIDER_SITE_OTHER): Payer: BLUE CROSS/BLUE SHIELD | Admitting: Gynecology

## 2014-07-08 ENCOUNTER — Telehealth: Payer: Self-pay | Admitting: Gynecology

## 2014-07-08 ENCOUNTER — Encounter: Payer: Self-pay | Admitting: Gynecology

## 2014-07-08 ENCOUNTER — Ambulatory Visit (INDEPENDENT_AMBULATORY_CARE_PROVIDER_SITE_OTHER): Payer: BLUE CROSS/BLUE SHIELD | Admitting: Gynecology

## 2014-07-08 VITALS — BP 112/72

## 2014-07-08 DIAGNOSIS — E8770 Fluid overload, unspecified: Secondary | ICD-10-CM | POA: Diagnosis not present

## 2014-07-08 DIAGNOSIS — R635 Abnormal weight gain: Secondary | ICD-10-CM | POA: Diagnosis not present

## 2014-07-08 DIAGNOSIS — Z30430 Encounter for insertion of intrauterine contraceptive device: Secondary | ICD-10-CM | POA: Diagnosis not present

## 2014-07-08 DIAGNOSIS — D500 Iron deficiency anemia secondary to blood loss (chronic): Secondary | ICD-10-CM

## 2014-07-08 DIAGNOSIS — R609 Edema, unspecified: Secondary | ICD-10-CM

## 2014-07-08 DIAGNOSIS — Z975 Presence of (intrauterine) contraceptive device: Secondary | ICD-10-CM | POA: Insufficient documentation

## 2014-07-08 DIAGNOSIS — N938 Other specified abnormal uterine and vaginal bleeding: Secondary | ICD-10-CM | POA: Diagnosis not present

## 2014-07-08 LAB — COMPREHENSIVE METABOLIC PANEL
ALBUMIN: 4.3 g/dL (ref 3.5–5.2)
ALK PHOS: 77 U/L (ref 39–117)
ALT: 48 U/L — ABNORMAL HIGH (ref 0–35)
AST: 22 U/L (ref 0–37)
BUN: 11 mg/dL (ref 6–23)
CHLORIDE: 105 meq/L (ref 96–112)
CO2: 22 mEq/L (ref 19–32)
Calcium: 9.6 mg/dL (ref 8.4–10.5)
Creat: 0.64 mg/dL (ref 0.50–1.10)
Glucose, Bld: 94 mg/dL (ref 70–99)
POTASSIUM: 4.7 meq/L (ref 3.5–5.3)
SODIUM: 138 meq/L (ref 135–145)
TOTAL PROTEIN: 7.2 g/dL (ref 6.0–8.3)
Total Bilirubin: 0.2 mg/dL (ref 0.2–1.2)

## 2014-07-08 LAB — HEMOGLOBIN A1C
Hgb A1c MFr Bld: 6.2 % — ABNORMAL HIGH
Mean Plasma Glucose: 131 mg/dL — ABNORMAL HIGH

## 2014-07-08 NOTE — Telephone Encounter (Signed)
07/08/14-I checked pt BC for benefits for Mirena IUD for dx codes N93.8 and D50.0. Pt has a $5.00 copay for this. JF to insert today.wl

## 2014-07-08 NOTE — Progress Notes (Signed)
Patient presented to the office today because of her continuation of irregular vaginal bleeding. Patient's history and evaluations and treatment as follows:  She was seen in the office on March 11 complaints of abdominal cramping for 2 days along with passage of a large blood clot was concerned. She denied any GU or GI complaints. She is currently on iron supplementation 1 daily. Her preoperative hemoglobin was 12.6.  On 06/16/2014 patient underwent laparoscopic bilateral salpingectomy and aspiration of right ovarian cyst along with lysis of abdominal pelvic adhesions (extensive) along with resectoscopic polypectomy. Intraoperative findings as well as pathology report which share with the patient as well as pictures as follows:  FINDINGS: Patient had extensive abdominal pelvic adhesions from previous midline scar/cesarean section. Simple appearing 2 cm right ovarian cyst left ovary was normal. Normal fallopian tubes. Omentum adhered to the fundal aspect of the uterus., Cul-de-sac free of adhesions and endometriotic implants. Smooth gallbladder and liver surface seen. Appendix not visualized  Diagnosis 1. Fallopian tube, bilateral - UNREMARKABLE FALLOPIAN TUBES. 2. Endometrial polyp - SECRETORY ENDOMETRIUM WITH DIFUSE PROGESTATIONAL CHANGES AND BENIGN ENDOMETRIAL POLYP. - NO HYPERPLASIA OR CARCINOMA.  On that office visit of March 11 she was treated for suspected endometritis and placed on Vibramycin 100 mg twice a day for 7 days. She was instructed to finish off her Megace 40 mg that she took for a total 10 days. She was given a prescription for Toradol to take 1 by mouth every 6 hours when necessary for 5 days. Today she denies any dysuria or frequency some spotting still present at times and some suprapubic discomfort when voiding like she states she had before after her C-section.  Patient was treated recently for suspected endometritis. In an effort to help regulate her cycle she is going  to be started on Alesse 37 year old contraceptive pill that I would like her to take continuously and withdrawal every 3 months. For her mild bladder spasm she will be prescribed Pyridium 200 mg to take 1 by mouth 3 times a day for 3 days. She was instructed also to continue her iron supplementation due to her anemia. Her last hemoglobin on March 22 was 10.70 she had a normal platelet count.  Exam today: Blood pressure 112/72 Well developed well nourished female complaining of easy bruisability, swelling of her feet, weight gain and continuation of her irregular vaginal bleeding despite having started the oral contraceptive pill approximate 3 weeks ago. Abdomen:Softnontender no rebound or guarding Pelvic:Bartholin urethra Skene glands within normal limits Vagina: Very little blood in vaginal vault Cervix: No active bleeding Uterus upper limits of normal slightly tender no rebound or guarding adnexa no palpable masses or tenderness Rectal:not examined  Patient was counseled for placement of Mirena IUD in an effort to control her cycle as we wait for additional blood work to come in. The risks benefits and pros and cons of the Mirena IUD were discussed and literature information was provided.                                                                    IUD procedure note        The cervix was cleansed with Betadine solution. A single-tooth tenaculum was placed on the anterior cervical lip. The uterus  sounded to 8 centimeter. The IUD was shown to the patient and inserted in a sterile fashion. The IUD string was trimmed. The single-tooth tenaculum was removed. Patient was instructed to return back to the office in one month for follow up.           Assessment/plan: Patient with persistent dysfunctional bleeding despite resectoscopic polypectomy recently. She was treated for suspected endometritis continues to bleed. Patient had a normal CBC and platelet count. She is mention now that she has  had history of easy bruisability suffer this reason were going to order von Willebrand panel today and because her swelling of her extremities were all scar check a comprehensive metabolic panel and a hemoglobin A1c. Patient has informed that she is moving to [place] in the next couple months.I have asked her to discontinue the oral contraceptive pill. She will be prescribed Motrin 800 mg take 1 by mouth 3 times a day for one week with meals. She will return back in one for follow-up after IUD placement. Patient fully where this IUD needs to be change in 5 years or when necessary.

## 2014-07-08 NOTE — Patient Instructions (Signed)
Levonorgestrel intrauterine device (IUD) Qu es este medicamento? El LEVONORGESTREL (DIU) es un dispositivo anticonceptivo (control de natalidad). El dispositivo se coloca dentro del tero por un profesional de la salud. Se utiliza para evitar el embarazo y tambin se puede utilizar para tratar el sangrado abundante que ocurre durante su perodo. Dependiendo del dispositivo, se puede utilizar por 3 a 5 aos. Este medicamento puede ser utilizado para otros usos; si tiene alguna pregunta consulte con su proveedor de atencin mdica o con su farmacutico. MARCAS COMERCIALES DISPONIBLES: LILETTA, Mirena, Skyla Qu le debo informar a mi profesional de la salud antes de tomar este medicamento? Necesita saber si usted presenta alguno de los siguientes problemas o situaciones: -exmen de Papanicolaou anormal -cncer de mama, cuello del tero o tero -diabetes -endometritis -si tiene una infeccin plvica o genital actual o en el pasado -tiene ms de una pareja sexual o si su pareja tiene ms de una pareja -enfermedad cardiaca -antecedente de embarazo tubrico o ectpico -problemas del sistema inmunolgico -DIU colocado -enfermedad heptica o tumor del hgado -problemas con la coagulacin o si toma diluyentes sanguneos -usa medicamentos intravenoso -forma inusual del tero -sangrado vaginal que no tiene explicacin -una reaccin alrgica o inusual al levonorgestrel, a otras hormonas, a la silicona o polietilenos, a otros medicamentos, alimentos, colorantes o conservantes -si est embarazada o buscando quedar embarazada -si est amamantando a un beb Cmo debo utilizar este medicamento? Un profesional de la salud coloca este dispositivo en el tero. Hable con su pediatra para informarse acerca del uso de este medicamento en nios. Puede requerir atencin especial. Sobredosis: Pngase en contacto inmediatamente con un centro toxicolgico o una sala de urgencia si usted cree que haya tomado  demasiado medicamento. ATENCIN: Este medicamento es solo para usted. No comparta este medicamento con nadie. Qu sucede si me olvido de una dosis? No se aplica en este caso. Qu puede interactuar con este medicamento? No tome esta medicina con ninguno de los siguientes medicamentos: -amprenavir -bosentano -fosamprenavir Esta medicina tambin puede interactuar con los siguientes medicamentos: -aprepitant -barbitricos para producir el sueo o para el tratamiento de convulsiones -bexaroteno -griseofulvina -medicamentos para tratar los convulsiones, tales como carbamazepina, etotona, felbamato, oxcarbazepina, fenitona, topiramato -modafinilo -pioglitazona -rifabutina -rifampicina -rifapentina -algunos medicamentos para tratar el virus VIH, tales como atazanavir, indinavir, lopinavir, nelfinavir, tipranavir, ritonavir -hierba de [place] -warfarina Puede ser que esta lista no menciona todas las posibles interacciones. Informe a su profesional de la salud de todos los productos a base de hierbas, medicamentos de venta libre o suplementos nutritivos que est tomando. Si usted fuma, consume bebidas alcohlicas o si utiliza drogas ilegales, indqueselo tambin a su profesional de la salud. Algunas sustancias pueden interactuar con su medicamento. A qu debo estar atento al usar este medicamento? Visite a su mdico o a su profesional de la salud para chequear su evolucin peridicamente. Visite a su mdico si usted o su pareja tiene relaciones sexuales con otras personas, se vuelve VIH positivo o contrae una enfermedad de transmisin sexual. Este medicamento no la protege de la infeccin por VIH (SIDA) ni de ninguna otra enfermedad de transmisin sexual. Puede controlar la ubicacin del DIU usted misma palpando con sus dedos limpios los hilos en la parte anterior de la vagina. No tire de los hilos. Es un buen hbito controlar la ubicacin del dispositivo despus de cada perodo menstrual. Si  no slo siente los hilos sino que adems siente otra parte ms del DIU o si no puede sentir los hilos, consulte a su   mdico inmediatamente. El DIU puede salirse por s solo. Puede quedar embarazada si el dispositivo se sale de su lugar. Utilice un mtodo anticonceptivo adicional, como preservativos, y consulte a su proveedor de atencin mdica s observa que el DIU se sali de su lugar. La utilizacin de tampones no cambia la posicin del DIU y no hay inconvenientes en usarlos durante su perodo. Qu efectos secundarios puedo tener al utilizar este medicamento? Efectos secundarios que debe informar a su mdico o a su profesional de la salud tan pronto como sea posible: -reacciones alrgicas como erupcin cutnea, picazn o urticarias, hinchazn de la cara, labios o lengua -fiebre, sntomas gripales -llagas genitales -alta presin sangunea -ausencia de un perodo menstrual durante 6 semanas mientras lo utiliza -dolor, hinchazn o calor en las piernas -dolor o sensibilidad del plvico -dolor de cabeza repentino o severo -signos de embarazo -calambres estomacales -falta de aliento repentina -problemas de coordinacin, del habla, al caminar -sangrado, flujo vaginal inusual -color amarillento de los ojos o la piel Efectos secundarios que, por lo general, no requieren atencin mdica (debe informarlos a su mdico o a su profesional de la salud si persisten o si son molestos): -acn -dolor de pecho -cambios en el deseo sexual o capacidad -cambios de peso -calambres, mareos o sensacin de desmayo mientras se introduce el dispositivo -dolor de cabeza -sangrado menstruales irregulares en los primeros 3 a 6 meses de usar -nuseas Puede ser que esta lista no menciona todos los posibles efectos secundarios. Comunquese a su mdico por asesoramiento mdico sobre los efectos secundarios. Usted puede informar los efectos secundarios a la FDA por telfono al 1-800-FDA-1088. Dnde debo guardar mi  medicina? No se aplica en este caso. ATENCIN: Este folleto es un resumen. Puede ser que no cubra toda la posible informacin. Si usted tiene preguntas acerca de esta medicina, consulte con su mdico, su farmacutico o su profesional de la salud.  2015, [employer]. (2011-05-04 16:57:41)  

## 2014-07-09 ENCOUNTER — Encounter: Payer: Self-pay | Admitting: Gynecology

## 2014-07-10 ENCOUNTER — Telehealth: Payer: Self-pay | Admitting: *Deleted

## 2014-07-10 ENCOUNTER — Ambulatory Visit: Payer: BLUE CROSS/BLUE SHIELD | Admitting: Gynecology

## 2014-07-10 ENCOUNTER — Encounter: Payer: Self-pay | Admitting: Internal Medicine

## 2014-07-10 ENCOUNTER — Ambulatory Visit (INDEPENDENT_AMBULATORY_CARE_PROVIDER_SITE_OTHER): Payer: BLUE CROSS/BLUE SHIELD | Admitting: Internal Medicine

## 2014-07-10 VITALS — BP 118/68 | HR 93 | Temp 98.2°F | Wt 174.2 lb

## 2014-07-10 DIAGNOSIS — G629 Polyneuropathy, unspecified: Secondary | ICD-10-CM

## 2014-07-10 DIAGNOSIS — R7989 Other specified abnormal findings of blood chemistry: Secondary | ICD-10-CM | POA: Diagnosis not present

## 2014-07-10 DIAGNOSIS — R945 Abnormal results of liver function studies: Secondary | ICD-10-CM

## 2014-07-10 LAB — SEDIMENTATION RATE: Sed Rate: 34 mm/hr — ABNORMAL HIGH (ref 0–22)

## 2014-07-10 LAB — FOLATE: FOLATE: 20.8 ng/mL (ref >5.9)

## 2014-07-10 LAB — VITAMIN B12: Vitamin B-12: 401 pg/mL (ref 211–911)

## 2014-07-10 NOTE — Progress Notes (Signed)
Subjective:    Patient ID: Sheila Foster, female    DOB: 04/16/1975, 39 y.o.   MRN: [id number]  DOS:  07/10/2014 Type of visit - description : acute Interval history: Since the last time she was here in 2014, she had a baby and had multiple gynecological surgeries. 2 months ago developed lower extremity paresthesias at her gynecologist recommended to see PCP. Paresthesias are from the mid pretibial area down, bilaterally, a feeling of tingling and needles in the toes. Symptoms worse at night.   Review of Systems Admits to back pain for at least 1 year after a fall when she landed on her back, had coccyalgia since, still an issue on and off; no radiation. Denies any bladder or bowel incontinence.   Past Medical History  Diagnosis Date  . Constipation   . Shingles 2010  . Contraception     condoms   . Cholelithiasis affecting pregnancy in third trimester, antepartum   . Endometritis July 2015  . SVD (spontaneous vaginal delivery) 1998, 2015    x 2  . Anemia     Past Surgical History  Procedure Laterality Date  . Cesarean section  2004    x1  . Wisdom tooth extraction    . Laparoscopic ovarian cystectomy Right 05/28/2014    Procedure: LAPAROSCOPIC RIGHT OVARIAN CYSTECTOMY;  Surgeon: Ok EdwardsJuan H Fernandez, MD;  Location: [facility];  Service: Gynecology;  Laterality: Right;  . Bilateral salpingectomy Bilateral 05/28/2014    Procedure: BILATERAL SALPINGECTOMY;  Surgeon: Ok EdwardsJuan H Fernandez, MD;  Location: [facility];  Service: Gynecology;  Laterality: Bilateral;  . Dilatation & curettage/hysteroscopy with myosure N/A 05/28/2014    Procedure: DILATATION & CURETTAGE/HYSTEROSCOPY WITH MYOSURE;  Surgeon: Ok EdwardsJuan H Fernandez, MD;  Location: [facility];  Service: Gynecology;  Laterality: N/A;  . Laparoscopic lysis of adhesions N/A 05/28/2014    Procedure: LAPAROSCOPIC LYSIS OF ADHESIONS;  Surgeon: Ok EdwardsJuan H Fernandez, MD;  Location: [facility];  Service: Gynecology;  Laterality: N/A;    History   Social History    . Marital Status: Married    Spouse Name: N/A  . Number of Children: 2  . Years of Education: N/A   Occupational History  . housekeeper     Social History Main Topics  . Smoking status: Never Smoker   . Smokeless tobacco: Never Used  . Alcohol Use: No  . Drug Use: No  . Sexual Activity: Yes    Birth Control/ Protection: Condom   Other Topics Concern  . Not on file   Social History Narrative   Education 9th grade   [place] from [place]        Medication List       This list is accurate as of: 07/10/14 11:59 PM.  Always use your most recent med list.               ferrous sulfate 325 (65 FE) MG tablet  Commonly known as:  FERROUSUL  Take 1 tablet (325 mg total) by mouth daily with breakfast.     ibuprofen 600 MG tablet  Commonly known as:  ADVIL,MOTRIN  Take 1 tablet (600 mg total) by mouth every 6 (six) hours.     levonorgestrel-ethinyl estradiol 0.1-20 MG-MCG tablet  Commonly known as:  AVIANE,ALESSE,LESSINA  Take continous and withdraw q 3 months           Objective:   Physical Exam BP 118/68 mmHg  Pulse 93  Temp(Src) 98.2 F (36.8 C) (Oral)  Wt 174 lb 4 oz (  79.039 kg)  SpO2 98%  LMP  (Approximate)  Breastfeeding? No  General:   Well developed, well nourished . NAD.  HEENT:  Normocephalic . Face symmetric, atraumatic Skin: Not pale. Not jaundice; no rash at the lower back Back: No TTP Neurologic:  alert & oriented X3.  Speech normal, gait appropriate for age and unassisted DTRs symmetric Motor symmetric Pinprick examination mild-patchy decrease in pinprick examination, mostly at the lateral distal aspect of the left leg and great toe on the right. Psych--  Cognition and judgment appear intact.  Cooperative with normal attention span and concentration.  Behavior appropriate. No anxious or depressed appearing.       Assessment & Plan:

## 2014-07-10 NOTE — Telephone Encounter (Signed)
-----   Message from Keenan BachelorKatherine R Annas, [place] sent at 07/09/2014  4:02 PM EDT ----- Regarding: referral to PCP Per Dr. Glenetta HewJF "Please make an [facility] for this patient [facility] for this patient who has prediabetes/diabetes which may need to be started on treatment. See if Dr. Merla Richesoolittle or Dr. Cleta Albertsaub we'll see her"  Victorino DikeJennifer, If you just let me know date/time I will relay to her when I call her about result.  Thanks!!

## 2014-07-10 NOTE — Telephone Encounter (Signed)
Pt has PCP which is Dr. Drue NovelPaz she is going to follow up with him which is scheduled in May.

## 2014-07-10 NOTE — Patient Instructions (Signed)
Get your blood work before you leave   Come back to the office in 1 month  for a routine check up   

## 2014-07-10 NOTE — Progress Notes (Signed)
Pre visit review using our clinic review tool, if applicable. No additional management support is needed unless otherwise documented below in the visit note. 

## 2014-07-11 LAB — VON WILLEBRAND PANEL
COAGULATION FACTOR VIII: 187 % — AB (ref 73–140)
Ristocetin Co-factor, Plasma: 119 % (ref 42–200)
VON WILLEBRAND ANTIGEN, PLASMA: 170 % (ref 50–217)

## 2014-07-11 LAB — ANA: Anti Nuclear Antibody(ANA): NEGATIVE

## 2014-07-11 NOTE — Assessment & Plan Note (Signed)
Symptoms consistent with neuropathy. The patient has pre-diabetes with a recent A1c of 6.2 ; has normal kidney function, LFTs are chronically elevated  Negative HIV 07-2013 Negative hepatitis B and C serology 03-2013 . Also had a fall about a year ago and has coccyalgia since then noting no bladder or bowel incontinence.  Plan: For neuropathy will check folic acid, B12, sed rate and a nerve conduction study For increased LFTs will check a ultrasound, SPE, ANA, anti-smooth muscle antibody Consider gabapentin and neurology referral depending on results.

## 2014-07-11 NOTE — Assessment & Plan Note (Signed)
See comments under neuropathy

## 2014-07-12 ENCOUNTER — Ambulatory Visit (HOSPITAL_BASED_OUTPATIENT_CLINIC_OR_DEPARTMENT_OTHER)
Admission: RE | Admit: 2014-07-12 | Discharge: 2014-07-12 | Disposition: A | Discharge status: Home / Self Care | Payer: BLUE CROSS/BLUE SHIELD | Source: Ambulatory Visit | Attending: Internal Medicine | Admitting: Internal Medicine

## 2014-07-12 DIAGNOSIS — R945 Abnormal results of liver function studies: Secondary | ICD-10-CM

## 2014-07-12 DIAGNOSIS — R109 Unspecified abdominal pain: Secondary | ICD-10-CM | POA: Diagnosis not present

## 2014-07-12 DIAGNOSIS — R7989 Other specified abnormal findings of blood chemistry: Secondary | ICD-10-CM | POA: Diagnosis not present

## 2014-07-12 LAB — PROTEIN ELECTROPHORESIS, SERUM, WITH REFLEX
ALBUMIN ELP: 4.3 g/dL (ref 3.8–4.8)
ALPHA-2-GLOBULIN: 0.9 g/dL (ref 0.5–0.9)
Alpha-1-Globulin: 0.4 g/dL — ABNORMAL HIGH (ref 0.2–0.3)
BETA 2: 0.6 g/dL — AB (ref 0.2–0.5)
BETA GLOBULIN: 0.7 g/dL — AB (ref 0.4–0.6)
Gamma Globulin: 1 g/dL (ref 0.8–1.7)
Total Protein, Serum Electrophoresis: 7.8 g/dL (ref 6.1–8.1)

## 2014-07-12 LAB — ANTI-SMOOTH MUSCLE ANTIBODY, IGG: Smooth Muscle Ab: 3 U (ref <20)

## 2014-07-12 NOTE — Progress Notes (Signed)
Error

## 2014-07-17 MED ORDER — GABAPENTIN 300 MG PO CAPS: ORAL_CAPSULE | ORAL | Status: DC

## 2014-07-17 NOTE — Addendum Note (Signed)
Addended by: Dorette GrateFAULKNER, Jorge Amparo C on: 07/17/2014 09:37 AM   Modules accepted: Orders

## 2014-07-22 ENCOUNTER — Ambulatory Visit: Payer: BLUE CROSS/BLUE SHIELD | Admitting: Family Medicine

## 2014-08-02 ENCOUNTER — Telehealth: Payer: Self-pay | Admitting: Internal Medicine

## 2014-08-02 NOTE — Telephone Encounter (Signed)
Pre Visit letter sent  °

## 2014-08-08 ENCOUNTER — Other Ambulatory Visit: Payer: Self-pay | Admitting: Gynecology

## 2014-08-08 ENCOUNTER — Other Ambulatory Visit: Payer: Self-pay

## 2014-08-08 ENCOUNTER — Encounter: Payer: Self-pay | Admitting: Gynecology

## 2014-08-08 ENCOUNTER — Ambulatory Visit (INDEPENDENT_AMBULATORY_CARE_PROVIDER_SITE_OTHER): Payer: BLUE CROSS/BLUE SHIELD

## 2014-08-08 ENCOUNTER — Ambulatory Visit (INDEPENDENT_AMBULATORY_CARE_PROVIDER_SITE_OTHER): Payer: BLUE CROSS/BLUE SHIELD | Admitting: Gynecology

## 2014-08-08 VITALS — BP 114/74

## 2014-08-08 DIAGNOSIS — Z0289 Encounter for other administrative examinations: Secondary | ICD-10-CM

## 2014-08-08 DIAGNOSIS — T8332XA Displacement of intrauterine contraceptive device, initial encounter: Secondary | ICD-10-CM

## 2014-08-08 DIAGNOSIS — N8320 Unspecified ovarian cysts: Secondary | ICD-10-CM | POA: Diagnosis not present

## 2014-08-08 DIAGNOSIS — N83202 Unspecified ovarian cyst, left side: Secondary | ICD-10-CM

## 2014-08-08 DIAGNOSIS — N921 Excessive and frequent menstruation with irregular cycle: Secondary | ICD-10-CM | POA: Diagnosis not present

## 2014-08-08 DIAGNOSIS — T8389XA Other specified complication of genitourinary prosthetic devices, implants and grafts, initial encounter: Secondary | ICD-10-CM

## 2014-08-08 DIAGNOSIS — Z975 Presence of (intrauterine) contraceptive device: Principal | ICD-10-CM

## 2014-08-08 DIAGNOSIS — R102 Pelvic and perineal pain: Secondary | ICD-10-CM

## 2014-08-08 DIAGNOSIS — D500 Iron deficiency anemia secondary to blood loss (chronic): Secondary | ICD-10-CM

## 2014-08-08 MED ORDER — MEDROXYPROGESTERONE ACETATE 150 MG/ML IM SUSP
150.0000 mg | Freq: Once | INTRAMUSCULAR | Status: AC
  Administered 2014-08-08: 150 mg via INTRAMUSCULAR

## 2014-08-08 NOTE — Addendum Note (Signed)
Addended by: Berna SpareASTILLO, BLANCA A on: 08/08/2014 03:29 PM   Modules accepted: Orders

## 2014-08-08 NOTE — Patient Instructions (Signed)
Quiste ovrico (Ovarian Cyst) Un quiste ovrico es una bolsa llena de lquido que se forma en el ovario. Los ovarios son los rganos pequeos que producen vulos en las mujeres. Se pueden formar varios tipos de quistes en los ovarios. La mayora no son cancerosos. Muchos de ellos no causan problemas y con frecuencia desaparecen solos. Algunos pueden provocar sntomas y requerir tratamiento. Los tipos ms comunes de quistes ovricos son los siguientes:  Quistes funcionales: estos quistes pueden aparecer todos los meses durante el ciclo menstrual. Esto es normal. Estos quistes suelen desaparecer con el prximo ciclo menstrual si la mujer no queda embarazada. En general, los quistes funcionales no tienen sntomas.  Endometriomas: estos quistes se forman a partir del tejido que recubre el tero. Tambin se denominan "quistes de chocolate" porque se llenan de sangre que se vuelve marrn. Este tipo de quiste puede provocar dolor en la zona inferior del abdomen durante la relacin sexual y con el perodo menstrual.  Cistoadenomas: este tipo se desarrolla a partir de las clulas que se ubican en el exterior del ovario. Estos quistes pueden ser muy grandes y causar dolor en la zona inferior del abdomen y durante la relacin sexual. Este tipo de quiste puede girar sobre s mismo, cortar el suministro de sangre y causar un dolor intenso. Tambin se puede romper con facilidad y provocar mucho dolor.  Quistes dermoides: este tipo de quiste a veces se encuentra en ambos ovarios. Estos quistes pueden contener diferentes tipos de tejidos del organismo, como piel, dientes, pelo o cartlago. Generalmente no tienen sntomas, a menos que sean muy grandes.  Quistes tecalutenicos: aparecen cuando se produce demasiada cantidad de cierta hormona (gonadotropina corinica humana) que estimula en exceso al ovario para que produzca vulos. Esto es ms frecuente despus de procedimientos que ayudan a la concepcin de un beb  (fertilizacin in vitro). CAUSAS   Los medicamentos para la fertilidad pueden provocar una afeccin mediante la cual se forman mltiples quistes de gran tamao en los ovarios. Esta se denomina sndrome de hiperestimulacin ovrica.  El sndrome del ovario poliqustico es una afeccin que puede causar desequilibrios hormonales, los cuales pueden dar como resultado quistes ovricos no funcionales. SIGNOS Y SNTOMAS  Muchos quistes ovricos no causan sntomas. Si se presentan sntomas, stos pueden ser:  Dolor o molestias en la pelvis.  Dolor en la parte baja del abdomen.  Dolor durante las relaciones sexuales.  Aumento del permetro abdominal (hinchazn).  Perodos menstruales anormales.  Aumento del dolor en los perodos menstruales.  Cese de los perodos menstruales sin estar embarazada. DIAGNSTICO  Estos quistes se descubren comnmente durante un examen de rutina o una exploracin ginecolgica anual. Es posible que se ordenen otros estudios para obtener ms informacin sobre el quiste. Estos estudios pueden ser:  Ecografas.  Radiografas de la pelvis.  Tomografa computada.  Resonancia magntica.  Anlisis de sangre. TRATAMIENTO  Muchos de los quistes ovricos desaparecen por s solos, sin tratamiento. Es probable que el mdico quiera controlar el quiste regularmente durante 2 o 3meses para ver si se produce algn cambio. En el caso de las mujeres en la menopausia, es particularmente importante controlar de cerca al quiste ya que el ndice de cncer de ovario en las mujeres menopusicas es ms alto. Cuando se requiere tratamiento, este puede incluir cualquiera de los siguientes:  Un procedimiento para drenar el quiste (aspiracin). Esto se puede realizar mediante el uso de una aguja grande y una ecografa. Tambin se puede hacer a travs de un procedimiento laparoscpico, En   este procedimiento, se inserta un tubo delgado que emite luz y que tiene una pequea cmara en un  extremo (laparoscopio) a travs de una pequea incisin.  Ciruga para extirpar el quiste completo. Esto se puede realizar mediante una ciruga laparoscpica o una ciruga abierta, la cual implica realizar una incisin ms grande en la parte inferior del abdomen.  Tratamiento hormonal o pldoras anticonceptivas. Estos mtodos a veces se usan para ayudar a disolver un quiste. INSTRUCCIONES PARA EL CUIDADO EN EL HOGAR   Tome solo medicamentos de venta libre o recetados, segn las indicaciones del mdico.  Concurra a las consultas de control con su mdico segn las indicaciones.  Hgase exmenes plvicos regulares y pruebas de Papanicolaou. SOLICITE ATENCIN MDICA SI:   Los perodos se atrasan, son irregulares, dolorosos o cesan.  El dolor plvico o abdominal no desaparece.  El abdomen se agranda o se hincha.  Siente presin en la vejiga o no puede vaciarla completamente.  Siente dolor durante las relaciones sexuales.  Tiene una sensacin de hinchazn, presin o molestias en el estmago.  Pierde peso sin razn aparente.  Siente un malestar generalizado.  Est estreida.  Pierde el apetito.  Le aparece acn.  Nota un aumento del vello corporal y facial.  Aumenta de peso sin hacer modificaciones en su actividad fsica y en su dieta habitual.  Sospecha que est embarazada. SOLICITE ATENCIN MDICA DE INMEDIATO SI:   Siente cada vez ms dolor abdominal.  Tiene malestar estomacal (nuseas) y vomita.  Tiene fiebre que se presenta de manera repentina.  Siente dolor abdominal al defecar.  Sus perodos menstruales son ms abundantes que lo habitual. ASEGRESE DE QUE:   Comprende estas instrucciones.  Controlar su afeccin.  Recibir ayuda de inmediato si no mejora o si empeora. Document Released: 12/23/2004 Document Revised: 03/20/2013 Exit[employer] Patient Information 2015 [employer]. This information is not intended to replace advice given to you by your health  care provider. Make sure you discuss any questions you have with your health care provider.  

## 2014-08-08 NOTE — Progress Notes (Signed)
Patient presented to the office today for 1 month follow-up up for placing the Mirena IUD to help with her bleeding as well as for contraception. Her history as follows:  She was seen in the office on March 11 complaints of abdominal cramping for 2 days along with passage of a large blood clot was concerned. She denied any GU or GI complaints. She is currently on iron supplementation 1 daily. Her preoperative hemoglobin was 12.6.  On 06/16/2014 patient underwent laparoscopic bilateral salpingectomy and aspiration of right ovarian cyst along with lysis of abdominal pelvic adhesions (extensive) along with resectoscopic polypectomy. Intraoperative findings as well as pathology report which share with the patient as well as pictures as follows:  FINDINGS: Patient had extensive abdominal pelvic adhesions from previous midline scar/cesarean section. Simple appearing 2 cm right ovarian cyst left ovary was normal. Normal fallopian tubes. Omentum adhered to the fundal aspect of the uterus., Cul-de-sac free of adhesions and endometriotic implants. Smooth gallbladder and liver surface seen. Appendix not visualized  Diagnosis 1. Fallopian tube, bilateral - UNREMARKABLE FALLOPIAN TUBES. 2. Endometrial polyp - SECRETORY ENDOMETRIUM WITH DIFUSE PROGESTATIONAL CHANGES AND BENIGN ENDOMETRIAL POLYP. - NO HYPERPLASIA OR CARCINOMA.  On that office visit of March 11 she was treated for suspected endometritis and placed on Vibramycin 100 mg twice a day for 7 days. She was instructed to finish off her Megace 40 mg that she took for a total 10 days. She was given a prescription for Toradol to take 1 by mouth every 6 hours when necessary for 5 days. Today she denies any dysuria or frequency some spotting still present at times and some suprapubic discomfort when voiding like she states she had before after her C-section.  Patient was treated recently for suspected endometritis. In an effort to help regulate her cycle  she is going to be started on Alesse 37 year old contraceptive pill that I would like her to take continuously and withdrawal every 3 months. For her mild bladder spasm she will be prescribed Pyridium 200 mg to take 1 by mouth 3 times a day for 3 days. She was instructed also to continue her iron supplementation due to her anemia. Her last hemoglobin on March 22 was 10.70 she had a normal platelet count.  Patient states that since the Mirena IUD was placed she had a normal menstrual cycle which ended a few days ago and just had some spotting.  Exam: Blood pressure once 14/74 Gen. appearance: Well developed well nourished female in no acute distress Pelvic: Bartholin urethra Skene was within normal limits Vagina: There was some blood in the vaginal vault Cervix: IUD string was visualized and long which was trimmed Bimanual exam: Uterus anteverted normal size shape and consistency nontender Adnexa: No palpable masses or tenderness Rectal exam: Not done  An ultrasound was done today to make sure that the IUD has not slid down into the cervical canal. Ultrasound today with the following findings:   uterus measured 8.7 x 6.7 x 5.1 cm with endometrial stripe of 6.8 mm. The IUD was seen in the cervix lower uterine segment. Right arm located near the C-section scar near cervix and left arm and the lower uterine segment. Right ovary was normal. A thinwall echo-free avascular cyst measuring 5.0 x 4.4 x 4.2 cm average size 4.5 cm was noted. No fluid in the cul-de-sac. Of note on March 22 her hemoglobin was 10.7 and she's currently taking her iron supplementation. All instructions were provided in Spanish.   The findings  were discussed with the patient and the IUD was removed. As a result of ovarian cysts she's going to  Be administered a shot of Depo-Provera 150 mg IM an effort to suppress the cyst. She's had a previous tubal ligation procedure. She will continue on her iron supplementation. [place]  [facility] [place] [place] the next several weeks. I've recommended that she follow-up with a gynecologist in 3 months for follow-up ultrasound to make sure that the resolution of this simple appearing cyst. An of this is help with her cycle control that she can continue receiving Depo-Provera IM every 3 months for several more months before discontinuing altogether. This will be at the discretion of her new OB/GYN at the new location. We'll give her copy of these notes to take with her.

## 2014-08-09 ENCOUNTER — Ambulatory Visit (INDEPENDENT_AMBULATORY_CARE_PROVIDER_SITE_OTHER): Payer: BLUE CROSS/BLUE SHIELD | Admitting: Internal Medicine

## 2014-08-09 ENCOUNTER — Encounter: Payer: Self-pay | Admitting: Internal Medicine

## 2014-08-09 VITALS — BP 124/78 | HR 82 | Temp 97.6°F | Ht 65.0 in | Wt 176.0 lb

## 2014-08-09 DIAGNOSIS — M25532 Pain in left wrist: Secondary | ICD-10-CM

## 2014-08-09 DIAGNOSIS — G629 Polyneuropathy, unspecified: Secondary | ICD-10-CM | POA: Diagnosis not present

## 2014-08-09 NOTE — Assessment & Plan Note (Signed)
Since the last office visit, labs were unremarkable, nerve conduction studies pending. Symptoms are actually decreasing despite the fact that she has been taking gabapentin sporadically without any side effects. Plan: Gabapentin twice a day for a month, then stop. Cancel nerve conduction study as symptoms are improving spontaneously If symptoms resurface patient to let me know

## 2014-08-09 NOTE — Progress Notes (Signed)
Pre visit review using our clinic review tool, if applicable. No additional management support is needed unless otherwise documented below in the visit note. 

## 2014-08-09 NOTE — Patient Instructions (Addendum)
Take gabapentin twice a day for a month, then stop. If the neuropathy is not gone let me know  Will refer you to orthopedic doctor

## 2014-08-09 NOTE — Progress Notes (Signed)
Subjective:    Patient ID: Sheila Foster, female    DOB: 06/01/1975, 39 y.o.   MRN: [id number]  DOS:  08/09/2014 Type of visit - description : f/u Interval history: Neuropathy, labs were unremarkable, she has been taking gabapentin sporadically, symptoms are actually getting better. Today complains of pain at the left wrist, symptoms started in March after they tried to put on IV in the area. At some point the area was swollen but she does not recall to be red or warm. Has problems with home chores due to wrist pain.   Review of Systems Recently the IUD was removed, just had a depo shot.  Past Medical History  Diagnosis Date  . Constipation   . Shingles 2010  . Contraception     condoms   . Cholelithiasis affecting pregnancy in third trimester, antepartum   . Endometritis July 2015  . SVD (spontaneous vaginal delivery) 1998, 2015    x 2  . Anemia     Past Surgical History  Procedure Laterality Date  . Cesarean section  2004    x1  . Wisdom tooth extraction    . Laparoscopic ovarian cystectomy Right 05/28/2014    Procedure: LAPAROSCOPIC RIGHT OVARIAN CYSTECTOMY;  Surgeon: Ok EdwardsJuan H Fernandez, MD;  Location: [facility];  Service: Gynecology;  Laterality: Right;  . Bilateral salpingectomy Bilateral 05/28/2014    Procedure: BILATERAL SALPINGECTOMY;  Surgeon: Ok EdwardsJuan H Fernandez, MD;  Location: [facility];  Service: Gynecology;  Laterality: Bilateral;  . Dilatation & curettage/hysteroscopy with myosure N/A 05/28/2014    Procedure: DILATATION & CURETTAGE/HYSTEROSCOPY WITH MYOSURE;  Surgeon: Ok EdwardsJuan H Fernandez, MD;  Location: [facility];  Service: Gynecology;  Laterality: N/A;  . Laparoscopic lysis of adhesions N/A 05/28/2014    Procedure: LAPAROSCOPIC LYSIS OF ADHESIONS;  Surgeon: Ok EdwardsJuan H Fernandez, MD;  Location: [facility];  Service: Gynecology;  Laterality: N/A;    History   Social History  . Marital Status: Married    Spouse Name: N/A  . Number of Children: 2  . Years of Education: N/A    Occupational History  . housekeeper     Social History Main Topics  . Smoking status: Never Smoker   . Smokeless tobacco: Never Used  . Alcohol Use: No  . Drug Use: No  . Sexual Activity: Yes    Birth Control/ Protection: Condom   Other Topics Concern  . Not on file   Social History Narrative   Education 9th grade   Original from [place]        Medication List       This list is accurate as of: 08/09/14 11:59 PM.  Always use your most recent med list.               ferrous sulfate 325 (65 FE) MG tablet  Commonly known as:  FERROUSUL  Take 1 tablet (325 mg total) by mouth daily with breakfast.     gabapentin 300 MG capsule  Commonly known as:  NEURONTIN  Take 1 tablet daily at bedtime for 1 week, then 1 tablet twice daily for 1 week, and then 1 tablet three times daily.     ibuprofen 600 MG tablet  Commonly known as:  ADVIL,MOTRIN  Take 1 tablet (600 mg total) by mouth every 6 (six) hours.           Objective:   Physical Exam  Constitutional: She appears well-developed and well-nourished. No distress.  Musculoskeletal:       Arms: Skin: Skin is  warm and dry. She is not diaphoretic.  Psychiatric: She has a normal mood and affect. Her behavior is normal. Judgment and thought content normal.   BP 124/78 mmHg  Pulse 82  Temp(Src) 97.6 F (36.4 C) (Oral)  Ht 5\' 5"  (1.651 m)  Wt 176 lb (79.833 kg)  BMI 29.29 kg/m2  SpO2 97%  LMP 08/01/2014 (Exact Date)  Breastfeeding? No       Assessment & Plan:

## 2014-08-10 DIAGNOSIS — M25539 Pain in unspecified wrist: Secondary | ICD-10-CM | POA: Insufficient documentation

## 2014-08-10 NOTE — Assessment & Plan Note (Signed)
Wrist pain, likely tendinitis, no evidence of phlebitis at this time. Symptoms are severe enough that she is having problems with ADLs. Refer to Ortho

## 2014-08-23 ENCOUNTER — Encounter: Payer: Self-pay | Admitting: Internal Medicine

## 2014-09-04 ENCOUNTER — Encounter: Payer: Self-pay | Admitting: Gynecology

## 2014-09-04 ENCOUNTER — Other Ambulatory Visit: Payer: Self-pay | Admitting: Gynecology

## 2014-09-04 ENCOUNTER — Ambulatory Visit (INDEPENDENT_AMBULATORY_CARE_PROVIDER_SITE_OTHER): Payer: BLUE CROSS/BLUE SHIELD | Admitting: Gynecology

## 2014-09-04 VITALS — BP 115/80 | Ht 65.0 in | Wt 172.0 lb

## 2014-09-04 DIAGNOSIS — Z30430 Encounter for insertion of intrauterine contraceptive device: Secondary | ICD-10-CM

## 2014-09-04 DIAGNOSIS — N938 Other specified abnormal uterine and vaginal bleeding: Secondary | ICD-10-CM | POA: Diagnosis not present

## 2014-09-04 DIAGNOSIS — D500 Iron deficiency anemia secondary to blood loss (chronic): Secondary | ICD-10-CM

## 2014-09-04 LAB — CBC WITH DIFFERENTIAL/PLATELET
Basophils Absolute: 0 10*3/uL (ref 0.0–0.1)
Basophils Relative: 0 % (ref 0–1)
EOS ABS: 0.1 10*3/uL (ref 0.0–0.7)
Eosinophils Relative: 1 % (ref 0–5)
HCT: 35.7 % — ABNORMAL LOW (ref 36.0–46.0)
Hemoglobin: 11.3 g/dL — ABNORMAL LOW (ref 12.0–15.0)
Lymphocytes Relative: 29 % (ref 12–46)
Lymphs Abs: 2.1 10*3/uL (ref 0.7–4.0)
MCH: 23.7 pg — AB (ref 26.0–34.0)
MCHC: 31.7 g/dL (ref 30.0–36.0)
MCV: 75 fL — AB (ref 78.0–100.0)
MONOS PCT: 4 % (ref 3–12)
Monocytes Absolute: 0.3 10*3/uL (ref 0.1–1.0)
Neutro Abs: 4.8 10*3/uL (ref 1.7–7.7)
Neutrophils Relative %: 66 % (ref 43–77)
Platelets: 262 10*3/uL (ref 150–400)
RBC: 4.76 MIL/uL (ref 3.87–5.11)
RDW: 17.1 % — AB (ref 11.5–15.5)
WBC: 7.3 10*3/uL (ref 4.0–10.5)

## 2014-09-04 MED ORDER — IBUPROFEN 800 MG PO TABS: 800.0000 mg | ORAL_TABLET | Freq: Three times a day (TID) | ORAL | Status: AC | PRN

## 2014-09-04 NOTE — Progress Notes (Signed)
patient presented to the office today with continued dysfunctional uterine bleeding. She received Depo-Provera 150 mg on last office visit on May the 12. Her history is as follows colon  She was seen in the office on March 11 complaints of abdominal cramping for 2 days along with passage of a large blood clot was concerned. She denied any GU or GI complaints. She is currently on iron supplementation 1 daily. Her preoperative hemoglobin was 12.6.  On 06/16/2014 patient underwent laparoscopic bilateral salpingectomy and aspiration of right ovarian cyst along with lysis of abdominal pelvic adhesions (extensive) along with resectoscopic polypectomy. Intraoperative findings as well as pathology report which share with the patient as well as pictures as follows:  FINDINGS: Patient had extensive abdominal pelvic adhesions from previous midline scar/cesarean section. Simple appearing 2 cm right ovarian cyst left ovary was normal. Normal fallopian tubes. Omentum adhered to the fundal aspect of the uterus., Cul-de-sac free of adhesions and endometriotic implants. Smooth gallbladder and liver surface seen. Appendix not visualized  Diagnosis 1. Fallopian tube, bilateral - UNREMARKABLE FALLOPIAN TUBES. 2. Endometrial polyp - SECRETORY ENDOMETRIUM WITH DIFUSE PROGESTATIONAL CHANGES AND BENIGN ENDOMETRIAL POLYP. - NO HYPERPLASIA OR CARCINOMA.  On that office visit of March 11 she was treated for suspected endometritis and placed on Vibramycin 100 mg twice a day for 7 days. She was instructed to finish off her Megace 40 mg that she took for a total 10 days. She was given a prescription for Toradol to take 1 by mouth every 6 hours when necessary for 5 days. Today she denies any dysuria or frequency some spotting still present at times and some suprapubic discomfort when voiding like she states she had before after her C-section.  Patient was treated recently for suspected endometritis. In an effort to help  regulate her cycle she is going to be started on Alesse 37 year old contraceptive pill that I would like her to take continuously and withdrawal every 3 months. For her mild bladder spasm she will be prescribed Pyridium 200 mg to take 1 by mouth 3 times a day for 3 days. She was instructed also to continue her iron supplementation due to her anemia. Her last hemoglobin on March 22 was 10.70 she had a normal platelet count.  Patient states that since the Mirena IUD was placed she had a normal menstrual cycle which ended a few days ago and just had some spotting.   An ultrasound done on that 08/08/2014 a demonstrated the following:  uterus measured 8.7 x 6.7 x 5.1 cm with endometrial stripe of 6.8 mm. The IUD was seen in the cervix lower uterine segment. Right arm located near the C-section scar near cervix and left arm and the lower uterine segment. Right ovary was normal. A thinwall echo-free avascular cyst measuring 5.0 x 4.4 x 4.2 cm average size 4.5 cm was noted. No fluid in the cul-de-sac. Of note on March 22 her hemoglobin was 10.7 and she's currently taking her iron supplementation. Her IUD was removed and she was given a shot of Depo-Provera and 50 mg IM an she is here today with continued vaginal bleeding. Review of her blood work and demonstrated that she had a normal platelet count  And was negative for von Willebrand  Antigen. 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 Appearance well-developed: Nourished female with above mentioned complaint Abdomen: Soft nontender no rebound or guarding Pelvic: Bartholin urethra Skene was within normal  limits Vagina: Menstrual blood was present Cervix: No active bleeding was noted Uterus anteverted normal size shape and consistency nontender Adnexa: No palpable masses or tenderness Rectal exam not done    assessment/plan: Patient with continued vaginal bleeding despite having  Received the  Depo-Provera IM. We are going to make arrangements to place a Mirena IUD once again by Dr. Aubery Lapping guidance to see if a will stay in place and help control her bleeding. She is leaving for [place] permanently later this month. I did give her the option to stay here and that we would operate on her or we can give this a try and if it works fine if not she will follow-up in [place]. We are going to check with insurance coverage and schedule the IUD placed under ultrasound guidance within the next 48 hours. She will stop by the lab to check her CBC today. I prescribed Motrin 800 mg one by mouth 3 times a day to help as well.

## 2014-09-05 ENCOUNTER — Ambulatory Visit: Payer: BLUE CROSS/BLUE SHIELD | Admitting: Gynecology

## 2014-09-05 ENCOUNTER — Ambulatory Visit: Payer: BLUE CROSS/BLUE SHIELD

## 2014-09-05 ENCOUNTER — Encounter: Payer: Self-pay | Admitting: Gynecology

## 2014-09-05 ENCOUNTER — Other Ambulatory Visit: Payer: Self-pay | Admitting: Gynecology

## 2014-09-05 ENCOUNTER — Ambulatory Visit (INDEPENDENT_AMBULATORY_CARE_PROVIDER_SITE_OTHER): Payer: BLUE CROSS/BLUE SHIELD

## 2014-09-05 ENCOUNTER — Ambulatory Visit (INDEPENDENT_AMBULATORY_CARE_PROVIDER_SITE_OTHER): Payer: BLUE CROSS/BLUE SHIELD | Admitting: Gynecology

## 2014-09-05 DIAGNOSIS — N8320 Unspecified ovarian cysts: Secondary | ICD-10-CM

## 2014-09-05 DIAGNOSIS — Z30430 Encounter for insertion of intrauterine contraceptive device: Secondary | ICD-10-CM

## 2014-09-05 DIAGNOSIS — D500 Iron deficiency anemia secondary to blood loss (chronic): Secondary | ICD-10-CM

## 2014-09-05 DIAGNOSIS — N83202 Unspecified ovarian cyst, left side: Secondary | ICD-10-CM

## 2014-09-05 NOTE — Patient Instructions (Signed)
Colocacin de un dispositivo intrauterino - Cuidados posteriores (Intrauterine Device Insertion, Care After) Siga estas instrucciones durante las prximas semanas. Estas indicaciones le proporcionan informacin general acerca de cmo deber cuidarse despus del procedimiento. El mdico tambin podr darle instrucciones ms especficas. El tratamiento ha sido planificado segn las prcticas mdicas actuales, pero en algunos casos pueden ocurrir problemas. Comunquese con el mdico si tiene algn problema o tiene dudas despus del procedimiento. QU ESPERAR DESPUS DEL PROCEDIMIENTO La insercin del DIU puede causar molestias, como clicos. que deberan mejorar una vez que el DIU est en su lugar. Podr tener sangrado despus del procedimiento. Esto es normal. Vara desde un sangrado ligero durante un par de das hasta un sangrado similar al menstrual. Cuando el DIU est en su lugar, se extender un hilo de 1 a 2pulgadas (2,5 a 5cm) por el cuello del tero en la vagina. El hilo no debera molestarle a usted ni a su pareja. De lo contrario, consulte con su mdico.  INSTRUCCIONES PARA EL CUIDADO EN EL HOGAR   Controle su DIU para asegurarse de que est en su lugar, antes de reanudar la actividad sexual. Tiene que sentir los hilos. Si no los siente, algo puede estar mal. El DIU puede haberse salido del tero o ste puede haber sido atravesado (perforado) durante la colocacin. Adems, si los hilos son ms largos, puede significar que el DIU se est saliendo del tero. Si ocurre alguno de estos problemas, no estar protegida y podr quedar embarazada.  Puede volver a tener relaciones sexuales si no tiene problemas con el DIU. El DIU de cobre se considera efectivo y funciona de inmediato, si se inserta dentro de los 7 das del inicio del perodo. Ser necesario que utilice un mtodo anticonceptivo adicional durante 7 das, si el DIU se inserta en algn otro momento del ciclo.  Controle que el DIU sigue en su  lugar sintiendo los hilos despus de cada perodo menstrual.  Es posible que necesite tomar analgsicos, como acetaminofeno o ibuprofeno. Tome todos los medicamentos como le indic el mdico. SOLICITE ATENCIN MDICA SI:   Tiene un sangrado ms abundante o dura ms de un ciclo menstrual normal.  Tiene fiebre.  Siente clicos o dolor abdominal que no se alivian con medicamentos.  Siente dolor abdominal que no parece estar relacionado con el rea en que senta los clicos y el dolor anteriormente.  Se siente mareada, inusualmente dbil o se desmaya.  Tiene flujo vaginal u olores anormales.  Siente dolor durante las relaciones sexuales.  No puede sentir los hilos del DIU o los siente ms largos.  Siente que el DIU est en la abertura del cuello del tero, en la vagina.  Piensa que est embarazada o no tiene su perodo menstrual.  El hilo del DIU est lastimando a su pareja sexual. ASEGRESE DE QUE:  Comprende estas instrucciones.  Controlar su afeccin.  Recibir ayuda de inmediato si no mejora o si empeora. Document Released: 12/08/2011 Document Revised: 01/03/2013 [employer] Patient Information 2015 [employer]. This information is not intended to replace advice given to you by your health care provider. Make sure you discuss any questions you have with your health care provider.  

## 2014-09-05 NOTE — Progress Notes (Signed)
Patient presented to the office today for placement of the Mirena IUD under ultrasound guidance since back in April she had a Mirena placed but it fell out. Her history is as follows:  She was seen in the office on March 11 complaints of abdominal cramping for 2 days along with passage of a large blood clot was concerned. She denied any GU or GI complaints. She is currently on iron supplementation 1 daily. Her preoperative hemoglobin was 12.6.  On 06/16/2014 patient underwent laparoscopic bilateral salpingectomy and aspiration of right ovarian cyst along with lysis of abdominal pelvic adhesions (extensive) along with resectoscopic polypectomy. Intraoperative findings as well as pathology report which share with the patient as well as pictures as follows:  FINDINGS: Patient had extensive abdominal pelvic adhesions from previous midline scar/cesarean section. Simple appearing 2 cm right ovarian cyst left ovary was normal. Normal fallopian tubes. Omentum adhered to the fundal aspect of the uterus., Cul-de-sac free of adhesions and endometriotic implants. Smooth gallbladder and liver surface seen. Appendix not visualized  Diagnosis 1. Fallopian tube, bilateral - UNREMARKABLE FALLOPIAN TUBES. 2. Endometrial polyp - SECRETORY ENDOMETRIUM WITH DIFUSE PROGESTATIONAL CHANGES AND BENIGN ENDOMETRIAL POLYP. - NO HYPERPLASIA OR CARCINOMA.  On that office visit of March 11 she was treated for suspected endometritis and placed on Vibramycin 100 mg twice a day for 7 days. She was instructed to finish off her Megace 40 mg that she took for a total 10 days. She was given a prescription for Toradol to take 1 by mouth every 6 hours when necessary for 5 days. Today she denies any dysuria or frequency some spotting still present at times and some suprapubic discomfort when voiding like she states she had before after her C-section.  Patient was treated recently for suspected endometritis. In an effort to help  regulate her cycle she is going to be started on Alesse 37 year old contraceptive pill that I would like her to take continuously and withdrawal every 3 months. For her mild bladder spasm she will be prescribed Pyridium 200 mg to take 1 by mouth 3 times a day for 3 days. She was instructed also to continue her iron supplementation due to her anemia. Her last hemoglobin on March 22 was 10.70 she had a normal platelet count.  Patient states that since the Mirena IUD was placed she had a normal menstrual cycle which ended a few days ago and just had some spotting.  Exam: Blood pressure once 14/74 Gen. appearance: Well developed well nourished female in no acute distress Pelvic: Bartholin urethra Skene was within normal limits Vagina: There was some blood in the vaginal vault Cervix: IUD string was visualized and long which was trimmed Bimanual exam: Uterus anteverted normal size shape and consistency nontender Adnexa: No palpable masses or tenderness Rectal exam: Not done  An ultrasound was done today to make sure that the IUD has not slid down into the cervical canal. Ultrasound today with the following findings:  uterus measured 8.7 x 6.7 x 5.1 cm with endometrial stripe of 6.8 mm. The IUD was seen in the cervix lower uterine segment. Right arm located near the C-section scar near cervix and left arm and the lower uterine segment. Right ovary was normal. A thinwall echo-free avascular cyst measuring 5.0 x 4.4 x 4.2 cm average size 4.5 cm was noted. No fluid in the cul-de-sac. Of note on March 22 her hemoglobin was 10.7 and she's currently taking her iron supplementation. All instructions were provided in Spanish.  The  findings were discussed with the patient and the IUD was removed. As a result of ovarian cysts she's going to Be administered a shot of Depo-Provera 150 mg IM an effort to suppress the cyst. She's had a previous tubal ligation procedure. She will continue on her iron supplementation.  She is moving to [facility] in the next several weeks  She was counseled for placement of the Mirena IUD under ultrasound guidance. Ultrasound report: Uterus measured 8.2 cm right and left ovary were normal. A small thin follicle was noted on the left ovary measured 20 x 19 mm. Previous cyst not seen. The cervix was cleansed with Betadine solution. A CO2 tenaculum was placed on the anterior cervical lip. The uterus measured to 7-1/2 almost 8 cm. The Mirena IUD was placed in sterile fashion and at the same time with visualization during the ultrasound to make sure that the arms of the IUD are high in the fundus. The string was then cut patient tolerated procedure well.  Her most recent hemoglobin on June 8 was 11.3 with a normal platelet count of 20 62,000. Patient previously was tested and was negative for von Willebrand factor  Assessment/plan: Patient status post placement of Mirena IUD to assist in her cycle control. Patient with anemia. Patient with extensive evaluation as discussed above. She is moving to [facility] next week. I've asked to maintain a menstrual calendar she continues to bleed heavily she'll report to the gynecologist there because he ultimatum would be to schedule hysterectomy with ovarian conservation. She will continue to take her iron supplementation. She knows that the IUD is good for 5 years. Literature information was provided in [place]. We will provided with a copy of this office note so that she can take with her to the new provider in [place].

## 2014-09-09 ENCOUNTER — Encounter: Payer: Self-pay | Admitting: Gynecology

## 2014-10-29 ENCOUNTER — Telehealth: Payer: Self-pay | Admitting: *Deleted

## 2014-10-29 NOTE — Telephone Encounter (Signed)
Pt signed ROI received via fax from [facility]. Forwarded to [place] to scan/email to medical records. JG//CMA

## 2016-08-11 ENCOUNTER — Encounter: Payer: Self-pay | Admitting: Gynecology
